# Patient Record
Sex: Female | Born: 1980
Health system: Southern US, Community
[De-identification: ages and names within clinical notes are randomized; demographics above are authoritative.]

## PROBLEM LIST (undated history)

## (undated) ENCOUNTER — Ambulatory Visit (HOSPITAL_COMMUNITY): Discharge status: Absent without leave | Payer: BLUE CROSS/BLUE SHIELD

## (undated) ENCOUNTER — Emergency Department (HOSPITAL_BASED_OUTPATIENT_CLINIC_OR_DEPARTMENT_OTHER): Admission: EM | Source: Ambulatory Visit

## (undated) DIAGNOSIS — F419 Anxiety disorder, unspecified: Secondary | ICD-10-CM

## (undated) DIAGNOSIS — I10 Essential (primary) hypertension: Secondary | ICD-10-CM

## (undated) DIAGNOSIS — K219 Gastro-esophageal reflux disease without esophagitis: Secondary | ICD-10-CM

## (undated) DIAGNOSIS — G43909 Migraine, unspecified, not intractable, without status migrainosus: Secondary | ICD-10-CM

## (undated) HISTORY — PX: TUBAL LIGATION: SHX77

## (undated) HISTORY — DX: Essential (primary) hypertension: I10

## (undated) HISTORY — DX: Gastro-esophageal reflux disease without esophagitis: K21.9

## (undated) HISTORY — PX: CHOLECYSTECTOMY: SHX55

## (undated) HISTORY — DX: Migraine, unspecified, not intractable, without status migrainosus: G43.909

---

## 2014-06-27 ENCOUNTER — Encounter (HOSPITAL_COMMUNITY): Payer: Self-pay | Admitting: Emergency Medicine

## 2014-06-27 ENCOUNTER — Emergency Department (HOSPITAL_COMMUNITY)
Admission: EM | Admit: 2014-06-27 | Discharge: 2014-06-28 | Disposition: A | Discharge status: Home / Self Care | Payer: Medicaid Other | Attending: Emergency Medicine | Admitting: Emergency Medicine

## 2014-06-27 DIAGNOSIS — R51 Headache: Secondary | ICD-10-CM | POA: Diagnosis present

## 2014-06-27 DIAGNOSIS — G43909 Migraine, unspecified, not intractable, without status migrainosus: Secondary | ICD-10-CM

## 2014-06-27 MED ORDER — METOCLOPRAMIDE HCL 5 MG/ML IJ SOLN
10.0000 mg | Freq: Once | INTRAMUSCULAR | Status: AC
  Administered 2014-06-27: 10 mg via INTRAVENOUS
  Filled 2014-06-27: qty 2

## 2014-06-27 MED ORDER — KETOROLAC TROMETHAMINE 30 MG/ML IJ SOLN
30.0000 mg | Freq: Once | INTRAMUSCULAR | Status: AC
  Administered 2014-06-27: 30 mg via INTRAVENOUS
  Filled 2014-06-27: qty 1

## 2014-06-27 MED ORDER — DIPHENHYDRAMINE HCL 50 MG/ML IJ SOLN
25.0000 mg | Freq: Once | INTRAMUSCULAR | Status: AC
  Administered 2014-06-27: 25 mg via INTRAVENOUS
  Filled 2014-06-27: qty 1

## 2014-06-27 MED ORDER — SODIUM CHLORIDE 0.9 % IV BOLUS (SEPSIS)
1000.0000 mL | Freq: Once | INTRAVENOUS | Status: AC
Start: 2014-06-27 — End: 2014-06-27
  Administered 2014-06-27: 1000 mL via INTRAVENOUS

## 2014-06-27 NOTE — ED Notes (Addendum)
Pt c/o HA onset 0500, woke her from sleep. Denies n/v/d. Pt also c/o upper chest pain with breathing x 4-5 hours. Last took imitrex 1600

## 2014-06-27 NOTE — ED Provider Notes (Signed)
CSN: [id number]     Arrival date & time 06/27/14  2104 History   First MD Initiated Contact with Patient 06/27/14 2158     Chief Complaint  Patient presents with  . Headache     (Consider location/radiation/quality/duration/timing/severity/associated sxs/prior Treatment) HPI Comments: Patient is a 34 year old female with a past medical history of chronic migraines who presents with a headache since 5:00am. Patient reports a gradual onset and progressive worsening of the headache. The pain is sharp, constant and is located in generalized head without radiation. Patient has tried OTC medication for symptoms without relief. No alleviating/aggravating factors. Patient reports associated nausea and photophobia. Patient denies fever, vomiting, diarrhea, numbness/tingling, weakness, visual changes, congestion, chest pain, SOB, abdominal pain.      History reviewed. No pertinent past medical history. Past Surgical History  Procedure Laterality Date  . Cholecystectomy    . Tubal ligation     No family history on file. History  Substance Use Topics  . Smoking status: Never Smoker   . Smokeless tobacco: Not on file  . Alcohol Use: No   OB History    No data available     Review of Systems  Constitutional: Negative for fever, chills and fatigue.  HENT: Negative for trouble swallowing.   Eyes: Negative for visual disturbance.  Respiratory: Negative for shortness of breath.   Cardiovascular: Negative for chest pain and palpitations.  Gastrointestinal: Negative for nausea, vomiting, abdominal pain and diarrhea.  Genitourinary: Negative for dysuria and difficulty urinating.  Musculoskeletal: Negative for arthralgias and neck pain.  Skin: Negative for color change.  Neurological: Positive for headaches. Negative for dizziness and weakness.  Psychiatric/Behavioral: Negative for dysphoric mood.      Allergies  Review of patient's allergies indicates no known allergies.  Home  Medications   Prior to Admission medications   Medication Sig Start Date End Date Taking? Authorizing Provider  ibuprofen (ADVIL,MOTRIN) 200 MG tablet Take 400 mg by mouth every 8 (eight) hours as needed.   Yes Historical Provider, MD  SUMAtriptan (IMITREX) 100 MG tablet Take 1 tablet by mouth 2 (two) times daily as needed for migraine.  04/01/14  Yes Historical Provider, MD   LMP 06/24/2014 Physical Exam  Constitutional: She is oriented to person, place, and time. She appears well-developed and well-nourished. No distress.  HENT:  Head: Normocephalic and atraumatic.  Eyes: Conjunctivae and EOM are normal.  Neck: Normal range of motion.  Cardiovascular: Normal rate and regular rhythm.  Exam reveals no gallop and no friction rub.   No murmur heard. Pulmonary/Chest: Effort normal and breath sounds normal. She has no wheezes. She has no rales. She exhibits no tenderness.  Abdominal: Soft. She exhibits no distension. There is no tenderness. There is no rebound.  Musculoskeletal: Normal range of motion.  Neurological: She is alert and oriented to person, place, and time. No cranial nerve deficit. Coordination normal.  Speech is goal-oriented. Moves limbs without ataxia.   Skin: Skin is warm and dry.  Psychiatric: She has a normal mood and affect. Her behavior is normal.  Nursing note and vitals reviewed.   ED Course  Procedures (including critical care time) Labs Review Labs Reviewed - No data to display  Imaging Review No results found.   EKG Interpretation None      MDM   Final diagnoses:  Migraine without status migrainosus, not intractable, unspecified migraine type    10:14 PM Patient will have migraine cocktail. No neuro deficits. Vitals stable and patient afebrile.  12:22 AM Patient feeling better and will be discharged.     Emilia BeckKaitlyn Zakee Deerman, PA-C 06/28/14 0022  Benjiman CoreNathan Pickering, MD 06/28/14 1450

## 2015-03-22 ENCOUNTER — Encounter (HOSPITAL_COMMUNITY): Payer: Self-pay | Admitting: Emergency Medicine

## 2015-03-22 ENCOUNTER — Emergency Department (HOSPITAL_COMMUNITY)
Admission: EM | Admit: 2015-03-22 | Discharge: 2015-03-22 | Discharge status: Against Medical Advice | Payer: Medicaid Other | Attending: Emergency Medicine | Admitting: Emergency Medicine

## 2015-03-22 DIAGNOSIS — G43909 Migraine, unspecified, not intractable, without status migrainosus: Secondary | ICD-10-CM | POA: Insufficient documentation

## 2015-03-22 NOTE — ED Notes (Signed)
Family member states headache starting yesterday, patient states it usually comes from her sinuses (tender to maxillary and frontal sinus palpation). Headache at front of head radiating down into right ear and lower jaw. Vomited once this morning and still feels nauseous, eyes are very sensitive to light, pt took 500 mg tylenol at 13:00 this afternoon. Denies fever, cough

## 2015-06-07 ENCOUNTER — Emergency Department (HOSPITAL_COMMUNITY)
Admission: EM | Admit: 2015-06-07 | Discharge: 2015-06-07 | Disposition: A | Discharge status: Home / Self Care | Payer: Medicaid Other | Attending: Emergency Medicine | Admitting: Emergency Medicine

## 2015-06-07 ENCOUNTER — Encounter (HOSPITAL_COMMUNITY): Payer: Self-pay | Admitting: Emergency Medicine

## 2015-06-07 DIAGNOSIS — N12 Tubulo-interstitial nephritis, not specified as acute or chronic: Secondary | ICD-10-CM | POA: Insufficient documentation

## 2015-06-07 DIAGNOSIS — Z9851 Tubal ligation status: Secondary | ICD-10-CM | POA: Insufficient documentation

## 2015-06-07 DIAGNOSIS — Z9049 Acquired absence of other specified parts of digestive tract: Secondary | ICD-10-CM | POA: Insufficient documentation

## 2015-06-07 DIAGNOSIS — Z792 Long term (current) use of antibiotics: Secondary | ICD-10-CM | POA: Insufficient documentation

## 2015-06-07 DIAGNOSIS — Z79899 Other long term (current) drug therapy: Secondary | ICD-10-CM | POA: Insufficient documentation

## 2015-06-07 DIAGNOSIS — Z791 Long term (current) use of non-steroidal anti-inflammatories (NSAID): Secondary | ICD-10-CM | POA: Insufficient documentation

## 2015-06-07 LAB — COMPREHENSIVE METABOLIC PANEL
ALT: 19 U/L (ref 14–54)
AST: 19 U/L (ref 15–41)
Albumin: 3.9 g/dL (ref 3.5–5.0)
Alkaline Phosphatase: 67 U/L (ref 38–126)
Anion gap: 9 (ref 5–15)
BUN: 11 mg/dL (ref 6–20)
CO2: 23 mmol/L (ref 22–32)
Calcium: 8.7 mg/dL — ABNORMAL LOW (ref 8.9–10.3)
Chloride: 108 mmol/L (ref 101–111)
Creatinine, Ser: 0.56 mg/dL (ref 0.44–1.00)
GFR calc Af Amer: >60 mL/min (ref >60)
GFR calc non Af Amer: >60 mL/min (ref >60)
Glucose, Bld: 108 mg/dL — ABNORMAL HIGH (ref 65–99)
Potassium: 3.5 mmol/L (ref 3.5–5.1)
Sodium: 140 mmol/L (ref 135–145)
Total Bilirubin: 0.5 mg/dL (ref 0.3–1.2)
Total Protein: 7.1 g/dL (ref 6.5–8.1)

## 2015-06-07 LAB — CBC
HCT: 34.5 % — ABNORMAL LOW (ref 36.0–46.0)
Hemoglobin: 11.6 g/dL — ABNORMAL LOW (ref 12.0–15.0)
MCH: 29.7 pg (ref 26.0–34.0)
MCHC: 33.6 g/dL (ref 30.0–36.0)
MCV: 88.2 fL (ref 78.0–100.0)
Platelets: 261 10*3/uL (ref 150–400)
RBC: 3.91 MIL/uL (ref 3.87–5.11)
RDW: 13.3 % (ref 11.5–15.5)
WBC: 15.5 10*3/uL — ABNORMAL HIGH (ref 4.0–10.5)

## 2015-06-07 LAB — URINE MICROSCOPIC-ADD ON: Bacteria, UA: NONE SEEN

## 2015-06-07 LAB — URINALYSIS, ROUTINE W REFLEX MICROSCOPIC
Bilirubin Urine: NEGATIVE
Glucose, UA: NEGATIVE mg/dL
Ketones, ur: NEGATIVE mg/dL
Nitrite: NEGATIVE
Protein, ur: NEGATIVE mg/dL
Specific Gravity, Urine: 1.01 (ref 1.005–1.030)
pH: 6 (ref 5.0–8.0)

## 2015-06-07 LAB — LIPASE, BLOOD: Lipase: 24 U/L (ref 11–51)

## 2015-06-07 LAB — POC URINE PREG, ED: Preg Test, Ur: NEGATIVE

## 2015-06-07 MED ORDER — ONDANSETRON HCL 4 MG/2ML IJ SOLN
4.0000 mg | Freq: Once | INTRAMUSCULAR | Status: AC
  Administered 2015-06-07: 4 mg via INTRAVENOUS
  Filled 2015-06-07: qty 2

## 2015-06-07 MED ORDER — GI COCKTAIL ~~LOC~~
30.0000 mL | Freq: Once | ORAL | Status: AC
  Administered 2015-06-07: 30 mL via ORAL
  Filled 2015-06-07: qty 30

## 2015-06-07 MED ORDER — SODIUM CHLORIDE 0.9 % IV BOLUS (SEPSIS)
1000.0000 mL | Freq: Once | INTRAVENOUS | Status: AC
  Administered 2015-06-07: 1000 mL via INTRAVENOUS

## 2015-06-07 MED ORDER — KETOROLAC TROMETHAMINE 30 MG/ML IJ SOLN: 30.0000 mg | Freq: Once | INTRAMUSCULAR | Status: DC

## 2015-06-07 MED ORDER — ONDANSETRON 4 MG PO TBDP: 4.0000 mg | ORAL_TABLET | Freq: Three times a day (TID) | ORAL | Status: DC | PRN

## 2015-06-07 MED ORDER — KETOROLAC TROMETHAMINE 60 MG/2ML IM SOLN
60.0000 mg | Freq: Once | INTRAMUSCULAR | Status: AC
  Administered 2015-06-07: 60 mg via INTRAMUSCULAR
  Filled 2015-06-07: qty 2

## 2015-06-07 MED ORDER — CEPHALEXIN 500 MG PO CAPS: 500.0000 mg | ORAL_CAPSULE | Freq: Three times a day (TID) | ORAL | Status: AC

## 2015-06-07 MED ORDER — NAPROXEN 500 MG PO TABS: 500.0000 mg | ORAL_TABLET | Freq: Two times a day (BID) | ORAL | Status: DC

## 2015-06-07 NOTE — Discharge Instructions (Signed)
Pielonefritis en los adultos °(Pyelonephritis, Adult) °La pielonefritis es una infección del riñón. Los riñones son los órganos que filtran la sangre y eliminan los residuos del torrente sanguíneo a través de la orina. La orina pasa desde los riñones, a través de los uréteres, hacia la vejiga. Hay dos tipos principales de pielonefritis: °· Infecciones que se inician rápidamente sin síntomas previos (pielonefritis aguda). °· Infecciones que persisten durante un período prolongado (pielonefritis crónica). °En la mayoría de los casos, la infección desaparece con el tratamiento y no causa otros problemas. Las infecciones más graves o crónicas a veces pueden propagarse al torrente sanguíneo u ocasionar otros problemas en los riñones. °CAUSAS °Por lo general, entre las causas de esta afección, se incluyen las siguientes: °· Bacterias que pasan desde la vejiga al riñón a través de la orina infectada. La orina de la vejiga puede infectarse por bacterias relacionadas con estas causas: °¨ Infección en la vejiga (cistitis). °¨ Inflamación de la próstata (prostatitis). °¨ Relaciones sexuales en las mujeres. °· Bacterias que pasan del torrente sanguíneo al riñón. °FACTORES DE RIESGO °Es más probable que esta afección se manifieste en: °· Las embarazadas. °· Las personas de edad avanzada. °· Los diabéticos. °· Las personas que tienen cálculos en los riñones o la vejiga. °· Las personas que tienen otras anomalías en el riñón o los uréteres. °· Las personas que tienen una sonda vesical. °· Las personas con cáncer. °· Las personas que son sexualmente activas. °· Las mujeres que usan espermicidas. °· Las personas que han tenido una infección previa en las vías urinarias. °SÍNTOMAS °Los síntomas de esta afección incluyen lo siguiente: °· Ganas frecuentes de orinar. °· Necesidad intensa o persistente de orinar. °· Sensación de ardor o escozor al orinar. °· Dolor abdominal. °· Dolor de espalda. °· Dolor al costado del cuerpo o en la  fosa lumbar. °· Fiebre. °· Escalofríos. °· Sangre en la orina u orina oscura. °· Náuseas. °· Vómitos. °DIAGNÓSTICO °Esta afección se puede diagnosticar en función de lo siguiente: °· Examen físico e historia clínica. °· Análisis de orina. °· Análisis de sangre. °También pueden hacerle estudios de diagnóstico por imágenes de los riñones, por ejemplo, una ecografía o una tomografía computarizada. °TRATAMIENTO °El tratamiento de esta afección puede depender de la gravedad de la infección. °· Si la infección es leve y se detecta rápidamente, pueden administrarle antibióticos por vía oral. Deberá tomar líquido para permanecer hidratado. °· Si la infección es más grave, es posible que deban hospitalizarlo para administrarle antibióticos directamente en una vena a través de una vía intravenosa (IV). Quizás también deban administrarle líquidos a través de una vía intravenosa si no se encuentra bien hidratado. Después de la hospitalización, es posible que deba tomar antibióticos durante un tiempo. °Podrán prescribirle otros tratamientos según la causa de la infección. °INSTRUCCIONES PARA EL CUIDADO EN EL HOGAR °Medicamentos °· Tome los medicamentos de venta libre y los recetados solamente como se lo haya indicado el médico. °· Si le recetaron un antibiótico, tómelo como se lo haya indicado el médico. No deje de tomar los antibióticos aunque comience a sentirse mejor. °Instrucciones generales °· Beba suficiente líquido para mantener la orina clara o de color amarillo pálido. °· Evite la cafeína, el té y las bebidas gaseosas. Estas sustancias irritan la vejiga. °· Orine con frecuencia. Evite retener la orina durante largos períodos. °· Orine antes y después de las relaciones sexuales. °· Después de defecar, las mujeres deben higienizarse la región perineal desde adelante hacia atrás. Use cada trozo de   papel higiénico solo una vez. °· Concurra a todas las visitas de control como se lo haya indicado el médico. Esto es  importante. °SOLICITE ATENCIÓN MÉDICA SI: °· Los síntomas no mejoran después de 2 días de tratamiento. °· Los síntomas empeoran. °· Tiene fiebre. °SOLICITE ATENCIÓN MÉDICA DE INMEDIATO SI: °· No puede tomar los antibióticos ni ingerir líquidos. °· Comienza a sentir escalofríos. °· Vomita. °· Siente un dolor intenso en la espalda o en la fosa lumbar. °· Se desmaya o siente una debilidad extrema. °  °Esta información no tiene como fin reemplazar el consejo del médico. Asegúrese de hacerle al médico cualquier pregunta que tenga. °  °Document Released: 11/09/2004 Document Revised: 10/21/2014 °Elsevier Interactive Patient Education ©2016 [employer] Inc[employer] ° °

## 2015-06-07 NOTE — ED Notes (Signed)
Right lower quadrant abdominal pain radiating into back. Reported generalized body aches with fever. Denies nausea and vomiting. Last BM Friday, was constipated then. Feels warm to touch, took ibuprofen at 0500 this a.m.

## 2015-06-07 NOTE — ED Provider Notes (Signed)
CSN: [id number]     Arrival date & time 06/07/15  [id number] History   First MD Initiated Contact with Patient 06/07/15 [phone]     Chief Complaint  Patient presents with  . Abdominal Pain     (Consider location/radiation/quality/duration/timing/severity/associated sxs/prior Treatment) HPI Comments: Right sided abdominal pain, began Friday Dizziness and heart burn, after on Sunday  Took ibuprofen, ice and that didn't help 1 yr ago, similar pain, thought it was muscle sprain Nausea, 1 episode of emesis Sunday decreased appetite No diarrhea +Constipation Body aching, burning inside, hot/cold Constant pain  Patient is a 35 y.o. female presenting with abdominal pain. The history is provided by the spouse and the patient. The history is limited by a language barrier. No language interpreter was used (husband interpreting).  Abdominal Pain Associated symptoms: constipation, fever (hot and cold), nausea and vomiting   Associated symptoms: no chest pain, no cough, no diarrhea, no dysuria, no shortness of breath, no sore throat, no vaginal bleeding and no vaginal discharge     History reviewed. No pertinent past medical history. Past Surgical History  Procedure Laterality Date  . Cholecystectomy    . Tubal ligation     History reviewed. No pertinent family history. Social History  Substance Use Topics  . Smoking status: Never Smoker   . Smokeless tobacco: None  . Alcohol Use: No   OB History    No data available     Review of Systems  Constitutional: Positive for fever (hot and cold).  HENT: Negative for sore throat.   Eyes: Negative for visual disturbance.  Respiratory: Negative for cough and shortness of breath.   Cardiovascular: Negative for chest pain.  Gastrointestinal: Positive for nausea, vomiting, abdominal pain and constipation. Negative for diarrhea.  Genitourinary: Negative for dysuria, vaginal bleeding, vaginal discharge and difficulty urinating.  Musculoskeletal:  Negative for back pain and neck pain.  Skin: Negative for rash.  Neurological: Negative for syncope and headaches.      Allergies  Review of patient's allergies indicates no known allergies.  Home Medications   Prior to Admission medications   Medication Sig Start Date End Date Taking? Authorizing Provider  ibuprofen (ADVIL,MOTRIN) 200 MG tablet Take 800 mg by mouth every 4 (four) hours as needed for fever, headache, mild pain, moderate pain or cramping.    Yes Historical Provider, MD  methocarbamol (ROBAXIN) 500 MG tablet Take 500 mg by mouth every 6 (six) hours as needed for muscle spasms.   Yes Historical Provider, MD  cephALEXin (KEFLEX) 500 MG capsule Take 1 capsule (500 mg total) by mouth 3 (three) times daily. 06/07/15 06/21/15  Alvira MondayErin Venus Gilles, MD  naproxen (NAPROSYN) 500 MG tablet Take 1 tablet (500 mg total) by mouth 2 (two) times daily with a meal. 06/07/15   Alvira MondayErin Dallas Torok, MD  ondansetron (ZOFRAN ODT) 4 MG disintegrating tablet Take 1 tablet (4 mg total) by mouth every 8 (eight) hours as needed for nausea or vomiting. 06/07/15   Alvira MondayErin Aadil Sur, MD   BP 112/66 mmHg  Pulse 107  Temp(Src) 98.5 F (36.9 C) (Oral)  Resp 18  SpO2 97% Physical Exam  Constitutional: She is oriented to person, place, and time. She appears well-developed and well-nourished. No distress.  HENT:  Head: Normocephalic and atraumatic.  Eyes: Conjunctivae and EOM are normal.  Neck: Normal range of motion.  Cardiovascular: Normal rate, regular rhythm, normal heart sounds and intact distal pulses.  Exam reveals no gallop and no friction rub.   No murmur heard. Pulmonary/Chest: Effort  normal and breath sounds normal. No respiratory distress. She has no wheezes. She has no rales.  Abdominal: Soft. She exhibits no distension. There is tenderness in the right upper quadrant. There is CVA tenderness (right) and positive Murphy's sign. There is no rigidity, no guarding and no tenderness at McBurney's point.   Musculoskeletal: She exhibits no edema or tenderness.  Neurological: She is alert and oriented to person, place, and time.  Skin: Skin is warm and dry. No rash noted. She is not diaphoretic. No erythema.  Nursing note and vitals reviewed.   ED Course  Procedures (including critical care time) Labs Review Labs Reviewed  COMPREHENSIVE METABOLIC PANEL - Abnormal; Notable for the following:    Glucose, Bld 108 (*)    Calcium 8.7 (*)    All other components within normal limits  CBC - Abnormal; Notable for the following:    WBC 15.5 (*)    Hemoglobin 11.6 (*)    HCT 34.5 (*)    All other components within normal limits  URINALYSIS, ROUTINE W REFLEX MICROSCOPIC (NOT AT [facility]) - Abnormal; Notable for the following:    APPearance CLOUDY (*)    Hgb urine dipstick SMALL (*)    Leukocytes, UA LARGE (*)    All other components within normal limits  URINE MICROSCOPIC-ADD ON - Abnormal; Notable for the following:    Squamous Epithelial / LPF 6-30 (*)    All other components within normal limits  URINE CULTURE  LIPASE, BLOOD  POC URINE PREG, ED    Imaging Review No results found. I have personally reviewed and evaluated these images and lab results as part of my medical decision-making.   EKG Interpretation None      MDM   Final diagnoses:  Pyelonephritis   35 year old female with a history of cholecystectomy presents with concern for right-sided abdominal pain. Patient does not have lower abdominal tenderness, and have low suspicion for pelvic etiology of pain including low suspicion for tubo-ovarian abscess, torsion, PID. She does not have right lower quadrant tenderness on exam, have low suspicion for appendicitis by physical exam. Patient does have right upper quadrant tenderness on exam, however is postcholecystectomy.  Labs show leukocytosis, normal lipase and transaminases. Pregnancy test negative.  Reevalauted patient in the ED with continued benign exam, specifically no RLQ  tenderness and doubt appendicitis.  Urinalysis concerning for UTI. Given urinalysis and flank pain, feel patient has pyelonephritis. Given rx for keflex to take for 2 weeks and zofran. Patient discharged in stable condition with understanding of reasons to return.    Alvira Monday, MD 06/07/15 2149

## 2015-06-10 LAB — URINE CULTURE: Culture: 30000 — AB

## 2015-06-11 ENCOUNTER — Telehealth: Payer: Self-pay | Admitting: *Deleted

## 2015-06-11 NOTE — Progress Notes (Signed)
ED Antimicrobial Stewardship Positive Culture Follow Up   [name] is an 35 y.o. female who [facility] with a chief complaint of  Chief Complaint  Patient presents with  . Abdominal Pain    Recent Results (from the past 720 hour(s))  Urine culture     Status: Abnormal   Collection Time: 06/07/15 10:57 AM  Result Value Ref Range Status   Specimen Description URINE, RANDOM  Final   Special Requests NONE  Final   Culture 30,000 COLONIES/mL ESCHERICHIA COLI (A)  Final   Report Status 06/10/2015 FINAL  Final   Organism ID, Bacteria ESCHERICHIA COLI (A)  Final      Susceptibility   Escherichia coli - MIC*    AMPICILLIN >=32 RESISTANT Resistant     CEFAZOLIN >=64 RESISTANT Resistant     CEFTRIAXONE <=1 SENSITIVE Sensitive     CIPROFLOXACIN <=0.25 SENSITIVE Sensitive     GENTAMICIN <=1 SENSITIVE Sensitive     IMIPENEM <=0.25 SENSITIVE Sensitive     NITROFURANTOIN 32 SENSITIVE Sensitive     TRIMETH/SULFA >=320 RESISTANT Resistant     AMPICILLIN/SULBACTAM >=32 RESISTANT Resistant     PIP/TAZO >=128 RESISTANT Resistant     * 30,000 COLONIES/mL ESCHERICHIA COLI   Only 30 k ecoli, but diagnosed with pyelo   D/C Keflex add levofloxacin 250 mg qd x 1 week  ED Provider: Gennette PacNicole Pisciatta, PA-C  Paige Bruce 06/11/2015, 8:17 AM Infectious Diseases Pharmacist Phone# [phone]

## 2015-06-11 NOTE — ED Notes (Signed)
Post ED Visit - Positive Culture Follow-up: Successful Patient Follow-Up  Culture assessed and recommendations reviewed by: []  Enzo BiNathan Batchelder, Pharm.D. []  Celedonio MiyamotoJeremy Frens, Pharm.D., BCPS []  Garvin FilaMike Maccia, Pharm.D. []  Georgina PillionElizabeth Martin, Pharm.D., BCPS []  IowaMinh Pham, [address]Pharm[address]D., BCPS, AAHIVP []  Estella HuskMichelle Turner, Pharm.D., BCPS, AAHIVP []  Tennis Mustassie Stewart, Pharm.D. []  Sherle Poeob Vincent, [address]  Positive urine culture  []  Patient discharged without antimicrobial prescription and treatment is now indicated [x]  Organism is resistant to prescribed ED discharge antimicrobial []  Patient with positive blood cultures  Changes discussed with ED provider Wynetta EmeryNicole Pisciotta, PA-C New antibiotic prescription Levofloxacin 250mg  PO BID X 1 week Called to [facility], [address], [place], [phone]  Contacted patient, date 06/11/2015, time 1134   Lysle PearlRobertson, Corley Kohls [place]ley 06/11/2015, 11:30 AM

## 2015-11-29 ENCOUNTER — Emergency Department (HOSPITAL_BASED_OUTPATIENT_CLINIC_OR_DEPARTMENT_OTHER): Payer: BLUE CROSS/BLUE SHIELD

## 2015-11-29 ENCOUNTER — Emergency Department (HOSPITAL_BASED_OUTPATIENT_CLINIC_OR_DEPARTMENT_OTHER)
Admission: EM | Admit: 2015-11-29 | Discharge: 2015-11-29 | Disposition: A | Discharge status: Home / Self Care | Payer: BLUE CROSS/BLUE SHIELD | Attending: Emergency Medicine | Admitting: Emergency Medicine

## 2015-11-29 ENCOUNTER — Encounter (HOSPITAL_BASED_OUTPATIENT_CLINIC_OR_DEPARTMENT_OTHER): Payer: Self-pay

## 2015-11-29 DIAGNOSIS — R102 Pelvic and perineal pain: Secondary | ICD-10-CM | POA: Insufficient documentation

## 2015-11-29 DIAGNOSIS — R300 Dysuria: Secondary | ICD-10-CM | POA: Diagnosis present

## 2015-11-29 DIAGNOSIS — R1030 Lower abdominal pain, unspecified: Secondary | ICD-10-CM | POA: Diagnosis not present

## 2015-11-29 LAB — WET PREP, GENITAL
Clue Cells Wet Prep HPF POC: NONE SEEN
Sperm: NONE SEEN
Trich, Wet Prep: NONE SEEN
Yeast Wet Prep HPF POC: NONE SEEN

## 2015-11-29 LAB — URINALYSIS, ROUTINE W REFLEX MICROSCOPIC
Bilirubin Urine: NEGATIVE
Glucose, UA: NEGATIVE mg/dL
Hgb urine dipstick: NEGATIVE
Ketones, ur: NEGATIVE mg/dL
Nitrite: NEGATIVE
Protein, ur: NEGATIVE mg/dL
Specific Gravity, Urine: 1.003 — ABNORMAL LOW (ref 1.005–1.030)
pH: 6.5 (ref 5.0–8.0)

## 2015-11-29 LAB — URINE MICROSCOPIC-ADD ON

## 2015-11-29 LAB — PREGNANCY, URINE: Preg Test, Ur: NEGATIVE

## 2015-11-29 MED ORDER — CEPHALEXIN 500 MG PO CAPS: 500.0000 mg | ORAL_CAPSULE | Freq: Three times a day (TID) | ORAL | 0 refills | Status: DC

## 2015-11-29 MED ORDER — IBUPROFEN 400 MG PO TABS
600.0000 mg | ORAL_TABLET | Freq: Once | ORAL | Status: AC
  Administered 2015-11-29: 600 mg via ORAL
  Filled 2015-11-29: qty 1

## 2015-11-29 MED ORDER — ONDANSETRON HCL 4 MG PO TABS: 4.0000 mg | ORAL_TABLET | Freq: Four times a day (QID) | ORAL | 0 refills | Status: DC

## 2015-11-29 NOTE — ED Triage Notes (Addendum)
Pt c/o urinary frequency and pain as well as RLQ pain and lower back pain since yesterday.  Pt needs Spanish interpreter

## 2015-11-29 NOTE — Discharge Instructions (Signed)
Continue to take ibuprofen and tylenol for pain.   You are given antibiotics for potential UTI.  Follow-up with your primary care doctor.  Return for worsening symptoms, including fever, worsening pain, intractable vomiting or any other symptoms concerning to you.

## 2015-11-29 NOTE — ED Provider Notes (Signed)
[facility] Provider Note   CSN: [id number] Arrival date & time: 11/29/15  2036  By signing my name below, I, Soijett Blue, attest that this documentation has been prepared under the direction and in the presence of Lavera Guiseana Duo Tarrin Lebow, MD. Electronically Signed: Soijett Blue, ED Scribe. 11/29/15. 10:00 PM.   History   Chief Complaint Chief Complaint  Patient presents with  . Dysuria    HPI Paige Bruce is a 35 y.o. female who presents to the Emergency Department complaining of dysuria onset this morning. She states that she is having associated symptoms of suprapubic abdominal pain x this morning, nausea, vomiting, vaginal discharge, right lower back pain, HA, and urinary frequency with mild dysuria. Pt notes that she has had abdominal pain in the past due to infection to her ovary. She states that she has not tried any medications for the relief of her symptoms. She denies fever, vaginal bleeding, diarrhea, and any other symptoms.   The history is provided by the patient. A language interpreter was used (spanish).    History reviewed. No pertinent past medical history.  There are no active problems to display for this patient.   Past Surgical History:  Procedure Laterality Date  . CHOLECYSTECTOMY    . TUBAL LIGATION      OB History    No data available       Home Medications    Prior to Admission medications   Medication Sig Start Date End Date Taking? Authorizing Provider  cephALEXin (KEFLEX) 500 MG capsule Take 1 capsule (500 mg total) by mouth 3 (three) times daily. 11/29/15   Lavera Guiseana Duo Enza Shone, MD  ibuprofen (ADVIL,MOTRIN) 200 MG tablet Take 800 mg by mouth every 4 (four) hours as needed for fever, headache, mild pain, moderate pain or cramping.     Historical Provider, MD  methocarbamol (ROBAXIN) 500 MG tablet Take 500 mg by mouth every 6 (six) hours as needed for muscle spasms.    Historical Provider, MD  naproxen (NAPROSYN) 500 MG tablet Take 1 tablet (500  mg total) by mouth 2 (two) times daily with a meal. 06/07/15   Alvira MondayErin Schlossman, MD  ondansetron (ZOFRAN ODT) 4 MG disintegrating tablet Take 1 tablet (4 mg total) by mouth every 8 (eight) hours as needed for nausea or vomiting. 06/07/15   Alvira MondayErin Schlossman, MD  ondansetron (ZOFRAN) 4 MG tablet Take 1 tablet (4 mg total) by mouth every 6 (six) hours. 11/29/15   Lavera Guiseana Duo Telena Peyser, MD    Family History No family history on file.  Social History Social History  Substance Use Topics  . Smoking status: Never Smoker  . Smokeless tobacco: Not on file  . Alcohol use No     Allergies   Review of patient's allergies indicates no known allergies.   Review of Systems Review of Systems 10/14 systems reviewed and are negative other than those stated in the HPI  Physical Exam Updated Vital Signs BP 112/74 (BP Location: Right Arm)   Pulse 77   Temp 98.4 F (36.9 C) (Oral)   Resp 16   Ht 4\' 11"  (1.499 m)   Wt 139 lb (63 kg)   LMP 11/10/2015   SpO2 100%   BMI 28.07 kg/m   Physical Exam Physical Exam  Nursing note and vitals reviewed. Constitutional: Well developed, well nourished, non-toxic, and in no acute distress Head: Normocephalic and atraumatic.  Mouth/Throat: Oropharynx is clear and moist.  Neck: Normal range of motion. Neck supple.  Cardiovascular: Normal rate  and regular rhythm.   Pulmonary/Chest: Effort normal and breath sounds normal.  Abdominal: Soft. There is suprapubic tenderness. No tenderness at McBurney's point. Negative Murphy sign. There is no rebound and no guarding. No CVA tenderness GU: Chaperone present for exam. Copious vaginal discharge. No vaginal bleeding. No CMT. No adnexal tenderness or masses. Musculoskeletal: Normal range of motion.  Neurological: Alert, no facial droop, fluent speech, moves all extremities symmetrically Skin: Skin is warm and dry.  Psychiatric: Cooperative   ED Treatments / Results  DIAGNOSTIC STUDIES: Oxygen Saturation is 100% on RA, nl  by my interpretation.    COORDINATION OF CARE: 9:58 PM Discussed treatment plan with pt at bedside which includes UA, pelvic exam, wet prep, GC/Chlamydia probe, HIV, RPR, US pelvis, and pt agreed to plan.  Labs (all labs ordered are listed, but only abnormal results are displayed) Labs Reviewed  WET PREP, GENITAL - Abnormal; Notable for the following:       Result Value   WBC, Wet Prep HPF POC MANY (*)    All other components within normal limits  URINALYSIS, ROUTINE W REFLEX MICROSCOPIC (NOT AT [facility]) - Abnormal; Notable for the following:    APPearance CLOUDY (*)    Specific Gravity, Urine 1.003 (*)    Leukocytes, UA MODERATE (*)    All other components within normal limits  URINE MICROSCOPIC-ADD ON - Abnormal; Notable for the following:    Squamous Epithelial / LPF 0-5 (*)    Bacteria, UA FEW (*)    All other components within normal limits  URINE CULTURE  PREGNANCY, URINE  HIV ANTIBODY (ROUTINE TESTING)  RPR  GC/CHLAMYDIA PROBE AMP ([place]) NOT AT [facility]    Radiology US Transvaginal Non-ob  Result Date: 11/29/2015 CLINICAL DATA:  Post right lower quadrant suprapubic pain intermittently for 6 months. White heavy vaginal discharge for 2-3 days. History of tubal ligation. EXAM: TRANSABDOMINAL AND TRANSVAGINAL ULTRASOUND OF PELVIS DOPPLER ULTRASOUND OF OVARIES TECHNIQUE: Both transabdominal and transvaginal ultrasound examinations of the pelvis were performed. Transabdominal technique was performed for global imaging of the pelvis including uterus, ovaries, adnexal regions, and pelvic cul-de-sac. It was necessary to proceed with endovaginal exam following the transabdominal exam to visualize the uterus, endometrium and ovaries. Color and duplex Doppler ultrasound was utilized to evaluate blood flow to the ovaries. COMPARISON:  None. FINDINGS: Uterus Measurements: 9.5 x 5 x 6.8 cm. No fibroids or other mass visualized. Endometrium Thickness: 17 mm.  No focal abnormality visualized.  Right ovary Measurements: 3.4 x 1.4 x 2.4 cm. Normal appearance/no adnexal mass. Left ovary Measurements: 3.1 x 2.7 x 3.2 cm. 2.2 x all corpus luteal cyst suggested. Pulsed Doppler evaluation of both ovaries demonstrates normal low-resistance arterial and venous waveforms. Other findings No abnormal free fluid. IMPRESSION: Probable 2.2 x 1.9 x 2 cm corpus luteal cyst of the left ovary. No sonographic findings for the patient's right-sided nor suprapubic pain. Electronically Signed   By: Tollie Eth M.D.   On: 11/29/2015 23:20   US Pelvis Complete  Result Date: 11/29/2015 CLINICAL DATA:  Post right lower quadrant suprapubic pain intermittently for 6 months. White heavy vaginal discharge for 2-3 days. History of tubal ligation. EXAM: TRANSABDOMINAL AND TRANSVAGINAL ULTRASOUND OF PELVIS DOPPLER ULTRASOUND OF OVARIES TECHNIQUE: Both transabdominal and transvaginal ultrasound examinations of the pelvis were performed. Transabdominal technique was performed for global imaging of the pelvis including uterus, ovaries, adnexal regions, and pelvic cul-de-sac. It was necessary to proceed with endovaginal exam following the transabdominal exam to visualize the  uterus, endometrium and ovaries. Color and duplex Doppler ultrasound was utilized to evaluate blood flow to the ovaries. COMPARISON:  None. FINDINGS: Uterus Measurements: 9.5 x 5 x 6.8 cm. No fibroids or other mass visualized. Endometrium Thickness: 17 mm.  No focal abnormality visualized. Right ovary Measurements: 3.4 x 1.4 x 2.4 cm. Normal appearance/no adnexal mass. Left ovary Measurements: 3.1 x 2.7 x 3.2 cm. 2.2 x all corpus luteal cyst suggested. Pulsed Doppler evaluation of both ovaries demonstrates normal low-resistance arterial and venous waveforms. Other findings No abnormal free fluid. IMPRESSION: Probable 2.2 x 1.9 x 2 cm corpus luteal cyst of the left ovary. No sonographic findings for the patient's right-sided nor suprapubic pain. Electronically Signed    By: Tollie Eth M.D.   On: 11/29/2015 23:20   [place] Art/ven Flow Abd Pelv Doppler  Result Date: 11/29/2015 CLINICAL DATA:  Post right lower quadrant suprapubic pain intermittently for 6 months. White heavy vaginal discharge for 2-3 days. History of tubal ligation. EXAM: TRANSABDOMINAL AND TRANSVAGINAL ULTRASOUND OF PELVIS DOPPLER ULTRASOUND OF OVARIES TECHNIQUE: Both transabdominal and transvaginal ultrasound examinations of the pelvis were performed. Transabdominal technique was performed for global imaging of the pelvis including uterus, ovaries, adnexal regions, and pelvic cul-de-sac. It was necessary to proceed with endovaginal exam following the transabdominal exam to visualize the uterus, endometrium and ovaries. Color and duplex Doppler ultrasound was utilized to evaluate blood flow to the ovaries. COMPARISON:  None. FINDINGS: Uterus Measurements: 9.5 x 5 x 6.8 cm. No fibroids or other mass visualized. Endometrium Thickness: 17 mm.  No focal abnormality visualized. Right ovary Measurements: 3.4 x 1.4 x 2.4 cm. Normal appearance/no adnexal mass. Left ovary Measurements: 3.1 x 2.7 x 3.2 cm. 2.2 x all corpus luteal cyst suggested. Pulsed Doppler evaluation of both ovaries demonstrates normal low-resistance arterial and venous waveforms. Other findings No abnormal free fluid. IMPRESSION: Probable 2.2 x 1.9 x 2 cm corpus luteal cyst of the left ovary. No sonographic findings for the patient's right-sided nor suprapubic pain. Electronically Signed   By: Tollie Eth M.D.   On: 11/29/2015 23:20    Procedures Procedures (including critical care time)  Medications Ordered in ED Medications  ibuprofen (ADVIL,MOTRIN) tablet 600 mg (600 mg Oral Given 11/29/15 2334)     Initial Impression / Assessment and Plan / ED Course  I have reviewed the triage vital signs and the nursing notes.  Pertinent labs & imaging results that were available during my care of the patient were reviewed by me and considered in  my medical decision making (see chart for details).  Clinical Course   35 year old female who presents with low abdominal pain with vaginal discharge. Presentation, she is nontoxic in no acute distress with stable vital signs and is afebrile. She has a soft and benign abdomen. Her pain is primarily localized over the low midline pelvis and over the suprapubic abdomen. No CVA tenderness noted to suggest pyelonephritis. No tenderness at McBurney's point to raise concern for appendicitis. On pelvic exam overall was unremarkable, pelvic ultrasound with left corpus luteum cyst. No evidence of acute intrapelvic processes such as ovarian cyst, fibroids, or torsion. Given her urinary symptoms and UA with some leukocytes will treat for potential cystitis with course of keflex.   The patient appears reasonably screened and/or stabilized for discharge and I doubt any other medical condition or other [facility] requiring further screening, evaluation, or treatment in the ED at this time prior to discharge.  Strict return and follow-up instructions reviewed. She  expressed understanding of all discharge instructions and felt comfortable with the plan of care.     Final Clinical Impressions(s) / ED Diagnoses   Final diagnoses:  Pelvic pain  Acute pelvic pain, female  Suprapubic pain    New Prescriptions Discharge Medication List as of 11/29/2015 11:41 PM    START taking these medications   Details  cephALEXin (KEFLEX) 500 MG capsule Take 1 capsule (500 mg total) by mouth 3 (three) times daily., Starting Mon 11/29/2015, Print    ondansetron (ZOFRAN) 4 MG tablet Take 1 tablet (4 mg total) by mouth every 6 (six) hours., Starting Mon 11/29/2015, Print        I personally performed the services described in this documentation, which was scribed in my presence. The recorded information has been reviewed and is accurate.     Lavera Guise, MD 11/29/15 2352

## 2015-11-29 NOTE — ED Notes (Signed)
Patient transported to Ultrasound 

## 2015-11-29 NOTE — ED Notes (Signed)
MD at bedside discussing test results and dispo plan of care. 

## 2015-12-01 LAB — URINE CULTURE: Culture: NO GROWTH

## 2015-12-01 LAB — RPR: RPR Ser Ql: NONREACTIVE

## 2015-12-01 LAB — HIV ANTIBODY (ROUTINE TESTING W REFLEX): HIV Screen 4th Generation wRfx: NONREACTIVE

## 2015-12-01 LAB — GC/CHLAMYDIA PROBE AMP (~~LOC~~) NOT AT ARMC
Chlamydia: NEGATIVE
Neisseria Gonorrhea: NEGATIVE

## 2016-05-17 ENCOUNTER — Emergency Department (HOSPITAL_BASED_OUTPATIENT_CLINIC_OR_DEPARTMENT_OTHER)
Admission: EM | Admit: 2016-05-17 | Discharge: 2016-05-17 | Disposition: A | Discharge status: Home / Self Care | Payer: BLUE CROSS/BLUE SHIELD | Attending: Emergency Medicine | Admitting: Emergency Medicine

## 2016-05-17 ENCOUNTER — Encounter (HOSPITAL_BASED_OUTPATIENT_CLINIC_OR_DEPARTMENT_OTHER): Payer: Self-pay

## 2016-05-17 DIAGNOSIS — M79674 Pain in right toe(s): Secondary | ICD-10-CM | POA: Diagnosis present

## 2016-05-17 DIAGNOSIS — B351 Tinea unguium: Secondary | ICD-10-CM | POA: Diagnosis not present

## 2016-05-17 MED ORDER — IBUPROFEN 600 MG PO TABS: 600.0000 mg | ORAL_TABLET | Freq: Four times a day (QID) | ORAL | 0 refills | Status: DC | PRN

## 2016-05-17 MED ORDER — TERBINAFINE HCL 250 MG PO TABS: 250.0000 mg | ORAL_TABLET | Freq: Every day | ORAL | 0 refills | Status: AC

## 2016-05-17 MED FILL — IBUPROFEN 600 MG TABLET: 600 | 7 days supply | Qty: 30 | Fill #0

## 2016-05-17 MED FILL — TERBINAFINE HCL 250 MG TAB: 250 | 84 days supply | Qty: 84 | Fill #0

## 2016-05-17 NOTE — Discharge Instructions (Signed)
Take the medication as prescribed.   You can take ibuprofen as directed for pain.   Follow-up with your primary care doctor in 1 week for re-evaluation.   If you do not have a primary care doctor, you can use the list below to call one and arrange follow-up.   If you do not have a primary care doctor you see regularly, please you the list below. Please call them to arrange for follow-up.    No Primary Care Doctor Call [facility]  [phone] Other agencies that provide inexpensive medical care    [facility]  [phone]    [facility]  [phone]    [facility]  [phone]    [facility]  [phone]    [facility]  [phone]    [facility]  [phone]

## 2016-05-17 NOTE — ED Triage Notes (Signed)
c/o pain to right great toe x 2 days-denies injury-NAD-steady gait-husband interpreting

## 2016-05-17 NOTE — ED Provider Notes (Signed)
[facility]   CSN: [id number] Arrival date & time: 05/17/16  1428     History   Chief Complaint Chief Complaint  Patient presents with  . Toe Pain    HPI Paige Bruce is a 36 y.o. female who presents with 2 days of worsening right 1st toe pain. Patient states that pain is localized to her nail bed and does not radiate. She states it has worsened over the last 2 days. She has taken ibuprofen 400 mg with no improvement in pain. She denies any redness or bruising to toe.  She denies any injury to the toe or any history of gout. Patient does regularly get pedicures and states she received one a few weeks ago.   The history is provided by the patient.    History reviewed. No pertinent past medical history.  There are no active problems to display for this patient.   Past Surgical History:  Procedure Laterality Date  . CHOLECYSTECTOMY    . TUBAL LIGATION      OB History    No data available       Home Medications    Prior to Admission medications   Medication Sig Start Date End Date Taking? Authorizing Provider  ibuprofen (ADVIL,MOTRIN) 600 MG tablet Take 1 tablet (600 mg total) by mouth every 6 (six) hours as needed. 05/17/16   Maxwell Caul, PA-C  terbinafine (LAMISIL) 250 MG tablet Take 1 tablet (250 mg total) by mouth daily. 05/17/16 08/09/16  Maxwell Caul, PA-C    Family History No family history on file.  Social History Social History  Substance Use Topics  . Smoking status: Never Smoker  . Smokeless tobacco: Never Used  . Alcohol use No     Allergies   Patient has no known allergies.   Review of Systems Review of Systems  Constitutional: Negative for chills and fever.  Respiratory: Negative for shortness of breath.   Cardiovascular: Negative for chest pain.  Gastrointestinal: Negative for nausea and vomiting.  Musculoskeletal:       +Right 1st toe pain  Skin: Negative for color change.  All other systems reviewed  and are negative.    Physical Exam Updated Vital Signs BP 123/83 (BP Location: Left Arm)   Pulse 95   Temp 98.2 F (36.8 C) (Oral)   Resp 18   Ht  (1.6 m)   Wt 61.2 kg   LMP 05/02/2016   SpO2 99%   BMI 23.91 kg/m   Physical Exam  Constitutional: She is oriented to person, place, and time. She appears well-developed and well-nourished.  HENT:  Head: Normocephalic and atraumatic.  Eyes: Conjunctivae and EOM are normal. Right eye exhibits no discharge. Left eye exhibits no discharge. No scleral icterus.  Cardiovascular: Normal rate and regular rhythm.   Pulses:      Dorsalis pedis pulses are 2+ on the right side, and 2+ on the left side.  Pulmonary/Chest: Effort normal.  Musculoskeletal: She exhibits no deformity.  FROM of right 1st toe.  Neurological: She is alert and oriented to person, place, and time.  Full dorsiflexion and plantar flexion of bilateral feet intact.  Skin: Skin is warm and dry. Capillary refill takes less than 2 seconds.  Right 1st toe with thickened, yellow nail. No surrounding erythema, swelling, warmth or tenderness.   Psychiatric: She has a normal mood and affect. Her speech is normal and behavior is normal.     ED Treatments / Results  Labs (  all labs ordered are listed, but only abnormal results are displayed) Labs Reviewed - No data to display  EKG  EKG Interpretation None       Radiology No results found.  Procedures Procedures (including critical care time)  Medications Ordered in ED Medications - No data to display   Initial Impression / Assessment and Plan / ED Course  I have reviewed the triage vital signs and the nursing notes.  Pertinent labs & imaging results that were available during my care of the patient were reviewed by me and considered in my medical decision making (see chart for details).    36 yo F presents with 2 days of right first toe pain. History of pedicures. No history of gout. No concerning  infectious signs on physical exam. Physical exam consistent with onychomycosis.  History and physical suspicious for onychomycosis. Plan to treat patient with topical antifungal. Instructed patient she can take ibuprofen as needed for pain. Return precautions discussed. Provided patient with a list of clinics for follow up if needed. Patient and husband express understanding and agreement to plan.   Final Clinical Impressions(s) / ED Diagnoses   Final diagnoses:  Onychomycosis    New Prescriptions Discharge Medication List as of 05/17/2016  3:24 PM    START taking these medications   Details  ibuprofen (ADVIL,MOTRIN) 600 MG tablet Take 1 tablet (600 mg total) by mouth every 6 (six) hours as needed., Starting Wed 05/17/2016, Print    terbinafine (LAMISIL) 250 MG tablet Take 1 tablet (250 mg total) by mouth daily., Starting Wed 05/17/2016, Until Wed 08/09/2016, Print         Maxwell Caul, PA-C 05/17/16 1533    Charlynne Pander, MD 05/19/16 [phone]

## 2016-07-15 ENCOUNTER — Encounter (HOSPITAL_BASED_OUTPATIENT_CLINIC_OR_DEPARTMENT_OTHER): Payer: Self-pay | Admitting: *Deleted

## 2016-07-15 ENCOUNTER — Emergency Department (HOSPITAL_BASED_OUTPATIENT_CLINIC_OR_DEPARTMENT_OTHER)
Admission: EM | Admit: 2016-07-15 | Discharge: 2016-07-15 | Disposition: A | Discharge status: Home / Self Care | Payer: BLUE CROSS/BLUE SHIELD | Attending: Emergency Medicine | Admitting: Emergency Medicine

## 2016-07-15 DIAGNOSIS — S199XXA Unspecified injury of neck, initial encounter: Secondary | ICD-10-CM | POA: Diagnosis present

## 2016-07-15 DIAGNOSIS — M545 Low back pain: Secondary | ICD-10-CM | POA: Diagnosis not present

## 2016-07-15 DIAGNOSIS — Y92410 Unspecified street and highway as the place of occurrence of the external cause: Secondary | ICD-10-CM | POA: Diagnosis not present

## 2016-07-15 DIAGNOSIS — Y999 Unspecified external cause status: Secondary | ICD-10-CM | POA: Insufficient documentation

## 2016-07-15 DIAGNOSIS — Y939 Activity, unspecified: Secondary | ICD-10-CM | POA: Diagnosis not present

## 2016-07-15 MED ORDER — MELOXICAM 15 MG PO TABS: 15.0000 mg | ORAL_TABLET | Freq: Every day | ORAL | 0 refills | Status: DC

## 2016-07-15 MED ORDER — BACLOFEN 10 MG PO TABS: 10.0000 mg | ORAL_TABLET | Freq: Three times a day (TID) | ORAL | 0 refills | Status: DC
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
## 2016-07-15 NOTE — Discharge Instructions (Signed)
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
## 2016-10-22 ENCOUNTER — Emergency Department (HOSPITAL_BASED_OUTPATIENT_CLINIC_OR_DEPARTMENT_OTHER)
Admission: EM | Admit: 2016-10-22 | Discharge: 2016-10-22 | Disposition: A | Discharge status: Home / Self Care | Payer: BLUE CROSS/BLUE SHIELD | Attending: Emergency Medicine | Admitting: Emergency Medicine

## 2016-10-22 ENCOUNTER — Emergency Department (HOSPITAL_BASED_OUTPATIENT_CLINIC_OR_DEPARTMENT_OTHER): Payer: BLUE CROSS/BLUE SHIELD

## 2016-10-22 ENCOUNTER — Encounter (HOSPITAL_BASED_OUTPATIENT_CLINIC_OR_DEPARTMENT_OTHER): Payer: Self-pay | Admitting: Emergency Medicine

## 2016-10-22 DIAGNOSIS — M25511 Pain in right shoulder: Secondary | ICD-10-CM | POA: Diagnosis not present

## 2016-10-22 DIAGNOSIS — Y929 Unspecified place or not applicable: Secondary | ICD-10-CM | POA: Insufficient documentation

## 2016-10-22 DIAGNOSIS — Y9389 Activity, other specified: Secondary | ICD-10-CM | POA: Insufficient documentation

## 2016-10-22 DIAGNOSIS — Y999 Unspecified external cause status: Secondary | ICD-10-CM | POA: Insufficient documentation

## 2016-10-22 DIAGNOSIS — Z79899 Other long term (current) drug therapy: Secondary | ICD-10-CM | POA: Diagnosis not present

## 2016-10-22 DIAGNOSIS — X500XXA Overexertion from strenuous movement or load, initial encounter: Secondary | ICD-10-CM | POA: Diagnosis not present

## 2016-10-22 MED ORDER — METHOCARBAMOL 500 MG PO TABS: 500.0000 mg | ORAL_TABLET | Freq: Three times a day (TID) | ORAL | 0 refills | Status: DC | PRN

## 2016-10-22 MED ORDER — KETOROLAC TROMETHAMINE 30 MG/ML IJ SOLN
30.0000 mg | Freq: Once | INTRAMUSCULAR | Status: AC
  Administered 2016-10-22: 30 mg via INTRAMUSCULAR
  Filled 2016-10-22: qty 1

## 2016-10-22 NOTE — ED Triage Notes (Signed)
Patient states that she has had pain to her right shoulder x 1 week. Has been to an Urgent care, the medications that they gave her have not helped

## 2016-10-22 NOTE — ED Provider Notes (Signed)
[facility]   CSN: [id number] Arrival date & time: 10/22/16  1341     History   Chief Complaint Chief Complaint  Patient presents with  . Shoulder Pain    HPI Paige Bruce is a 36 y.o. female.  HPI 36 year old female who presents with gradually worsening right shoulder pain over the past 2 days. 3 days ago was moving a couch/sofa with her significant other. Did not have fall or trauma. Was seen at an urgent care and diagnosed with muscle skeletal pain. Started on ibuprofen. Pain is not improved with ibuprofen. Denies any numbness or weakness to the arm. No swelling or skin changes.   History reviewed. No pertinent past medical history.  There are no active problems to display for this patient.   Past Surgical History:  Procedure Laterality Date  . CHOLECYSTECTOMY    . TUBAL LIGATION      OB History    No data available       Home Medications    Prior to Admission medications   Medication Sig Start Date End Date Taking? Authorizing Provider  baclofen (LIORESAL) 10 MG tablet Take 1 tablet (10 mg total) by mouth 3 (three) times daily. 07/15/16   Arthor Captain, PA-C  ibuprofen (ADVIL,MOTRIN) 600 MG tablet Take 1 tablet (600 mg total) by mouth every 6 (six) hours as needed. 05/17/16   Maxwell Caul, PA-C  meloxicam (MOBIC) 15 MG tablet Take 1 tablet (15 mg total) by mouth daily. Take 1 daily with food. 07/15/16   Arthor Captain, PA-C  methocarbamol (ROBAXIN) 500 MG tablet Take 1 tablet (500 mg total) by mouth every 8 (eight) hours as needed for muscle spasms. 10/22/16   Lavera Guise, MD    Family History History reviewed. No pertinent family history.  Social History Social History  Substance Use Topics  . Smoking status: Never Smoker  . Smokeless tobacco: Never Used  . Alcohol use No     Allergies   Patient has no known allergies.   Review of Systems Review of Systems  Constitutional: Negative for fever.  Respiratory: Negative  for cough and shortness of breath.   Cardiovascular: Negative for chest pain.  Musculoskeletal:       Right shoulder pain   Skin: Negative for wound.  Allergic/Immunologic: Negative for immunocompromised state.  Neurological: Negative for weakness and numbness.  Hematological: Does not bruise/bleed easily.     Physical Exam Updated Vital Signs BP 116/67 (BP Location: Left Arm)   Pulse 74   Temp 98.7 F (37.1 C) (Oral)   Resp 20   Ht  (1.6 m)   Wt 63 kg (139 lb)   LMP 09/23/2016   SpO2 99%   BMI 24.62 kg/m   Physical Exam Physical Exam  Constitutional: Appears well-developed and well-nourished. No acute distress. HENT:  Head: Normocephalic.  Eyes: Conjunctivae are normal.  Cardiovascular: Normal rate and intact distal pulses.   +2 radial pulses Pulmonary/Chest: Effort normal. No respiratory distress.  Abdominal: Exhibits no distension.  Musculoskeletal: Limited range of motion due to right shoulder pain. Exhibits no deformity. There is tenderness to palpation of the right trapezius muscle extending to the shoulder.  Neurological: Alert. Fluent speech.  Skin: Skin is warm and dry.  intact innervation involving the radial, ulnar, median and axillary nerves of the right upper extremity  Psychiatric: Normal mood and affect. Behavior is normal.  Nursing note and vitals reviewed.   ED Treatments / Results  Labs (all labs  ordered are listed, but only abnormal results are displayed) Labs Reviewed - No data to display  EKG  EKG Interpretation None       Radiology Dg Shoulder Right  Result Date: 10/22/2016 CLINICAL DATA:  Right shoulder pain for 2 weeks without known injury. EXAM: RIGHT SHOULDER - 2+ VIEW COMPARISON:  None. FINDINGS: There is no evidence of fracture or dislocation. There is no evidence of arthropathy or other focal bone abnormality. Soft tissues are unremarkable. IMPRESSION: Normal right shoulder. Electronically Signed   By: Lupita RaiderJames  Green Jr, M.D.    On: 10/22/2016 14:33    Procedures Procedures (including critical care time)  Medications Ordered in ED Medications  ketorolac (TORADOL) 30 MG/ML injection 30 mg (30 mg Intramuscular Given 10/22/16 1430)     Initial Impression / Assessment and Plan / ED Course  I have reviewed the triage vital signs and the nursing notes.  Pertinent labs & imaging results that were available during my care of the patient were reviewed by me and considered in my medical decision making (see chart for details).     Presents with right shoulder pain after heavy lifting. Likely shoulder strain based on mechanism. X-ray visualized and shows no acute processes. Discussed continued supportive care with NSAIDs, heat packs, rest. Strict return and follow-up instructions reviewed. She and significant other expressed understanding of all discharge instructions and felt comfortable with the plan of care.   Final Clinical Impressions(s) / ED Diagnoses   Final diagnoses:  Acute pain of right shoulder    New Prescriptions New Prescriptions   METHOCARBAMOL (ROBAXIN) 500 MG TABLET    Take 1 tablet (500 mg total) by mouth every 8 (eight) hours as needed for muscle spasms.     Lavera GuiseLiu, Junita Kubota Duo, MD 10/22/16 [phone]

## 2016-10-22 NOTE — Discharge Instructions (Signed)
Your x-ray of the shoulder is normal.  Continue heat packs, ibuprofen. You can also take muscle relaxants.  Return for worsening symptoms, including fever, numbness/weakness of arm, or any other symptoms concerning to you.

## 2016-11-14 IMAGING — US US PELVIS COMPLETE
1 series · 13 of 25 positions shown · non-contrast
Comparison: None.

CLINICAL DATA: Post right lower quadrant suprapubic pain
intermittently for 6 months. White heavy vaginal discharge for 2-3
days. History of tubal ligation.

EXAM:
TRANSABDOMINAL AND TRANSVAGINAL ULTRASOUND OF PELVIS
DOPPLER ULTRASOUND OF OVARIES
TECHNIQUE: Both transabdominal and transvaginal ultrasound examinations of the
pelvis were performed. Transabdominal technique was performed for
global imaging of the pelvis including uterus, ovaries, adnexal
regions, and pelvic cul-de-sac.
It was necessary to proceed with endovaginal exam following the
transabdominal exam to visualize the uterus, endometrium and
ovaries. Color and duplex Doppler ultrasound was utilized to
evaluate blood flow to the ovaries.

[Series 1: us pelvis complete · 0.20mm/px · 13 of 109 slices shown]
[im 1/109]
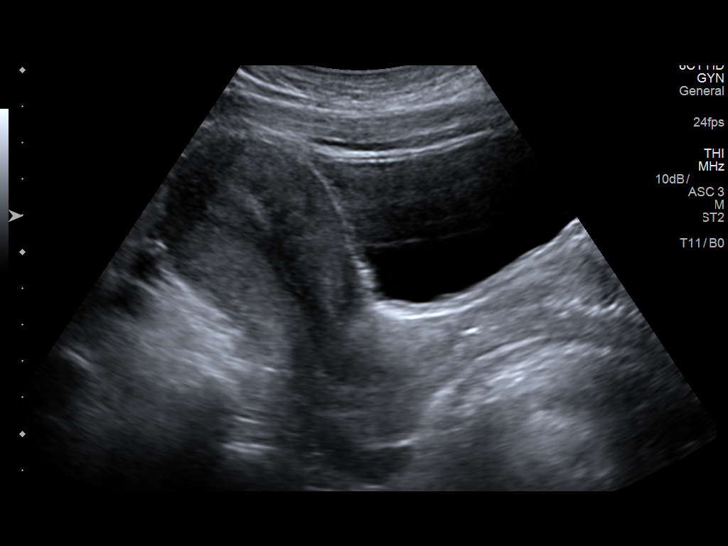
[im 10/109]
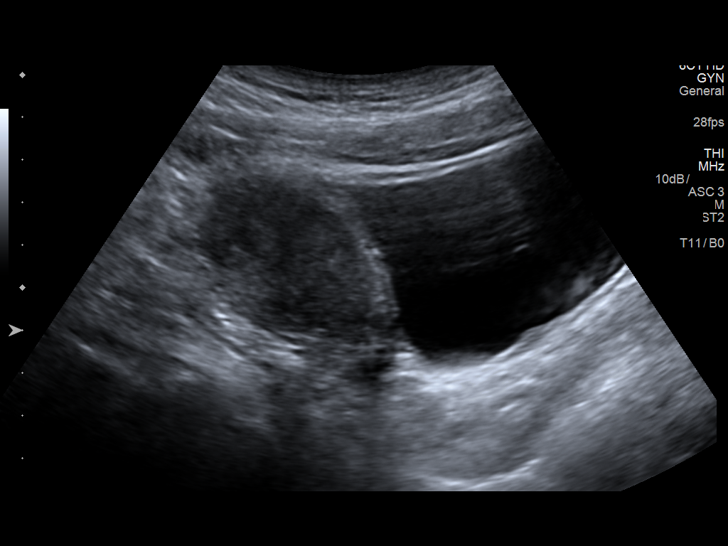
[im 19/109]
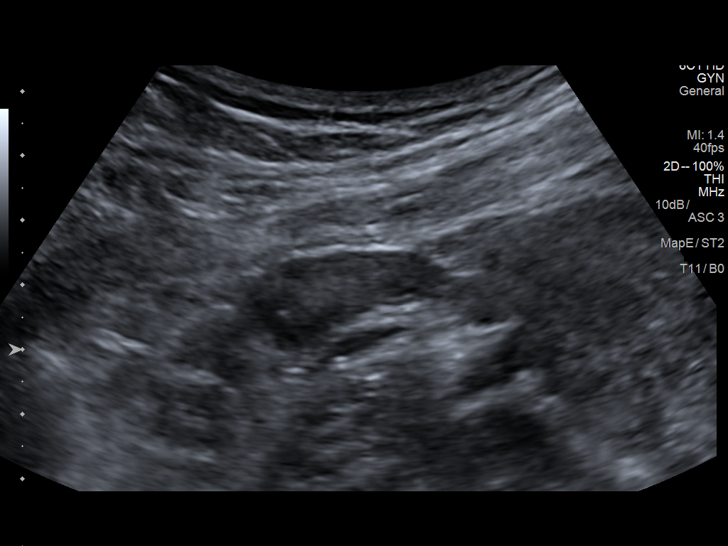
[im 28/109]
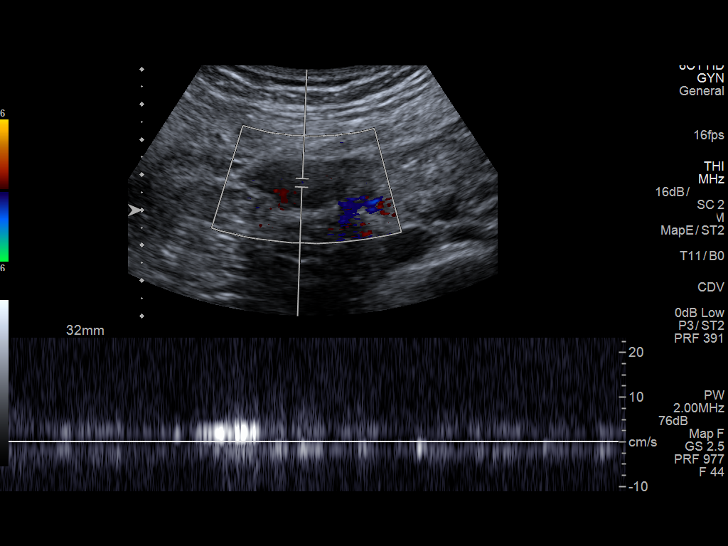
[im 37/109]
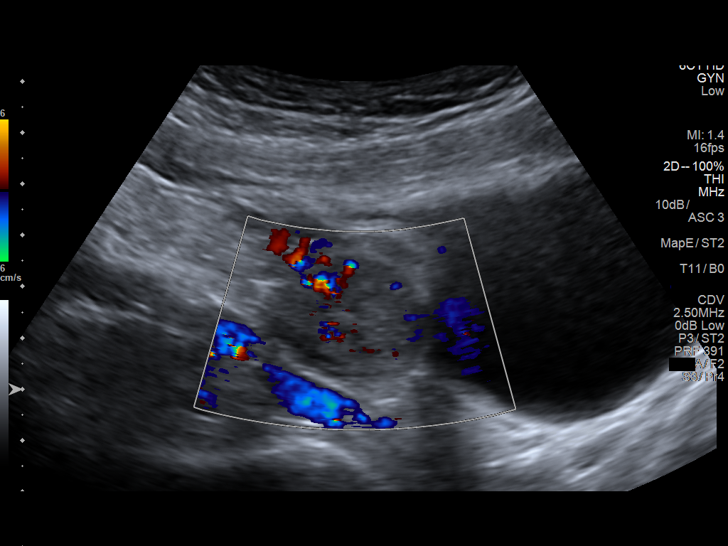
[im 46/109]
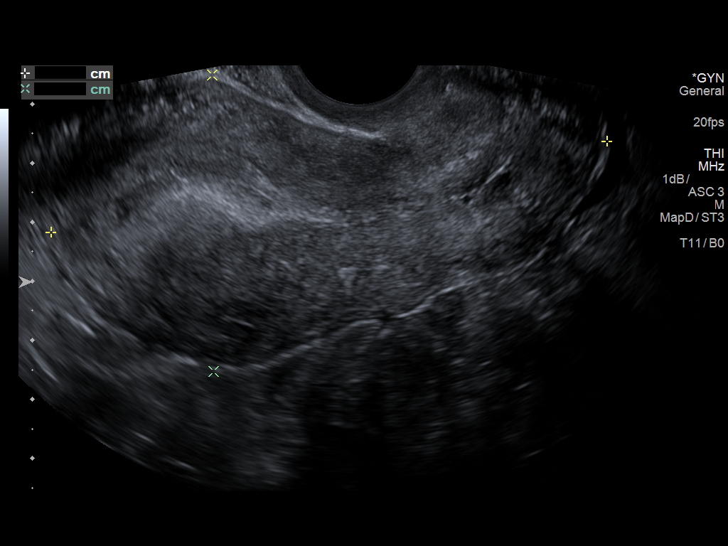
[im 55/109]
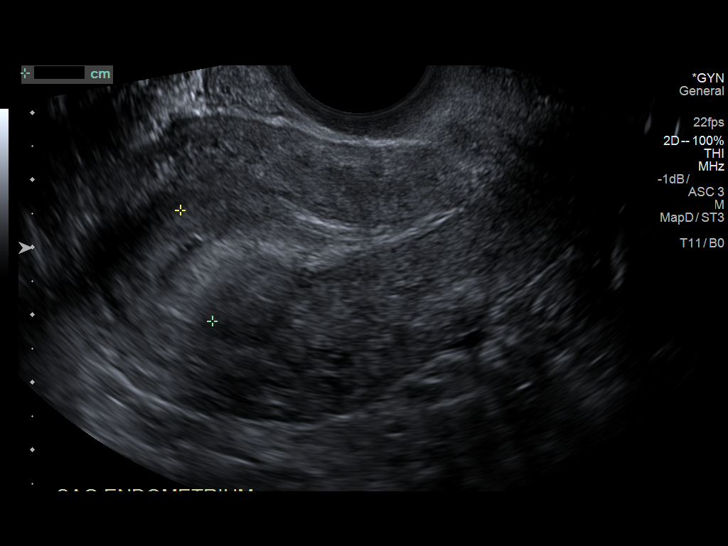
[im 64/109]
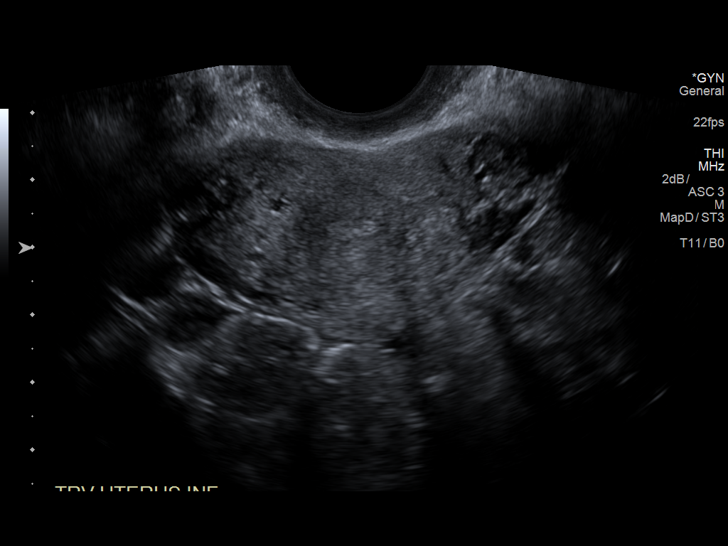
[im 73/109]
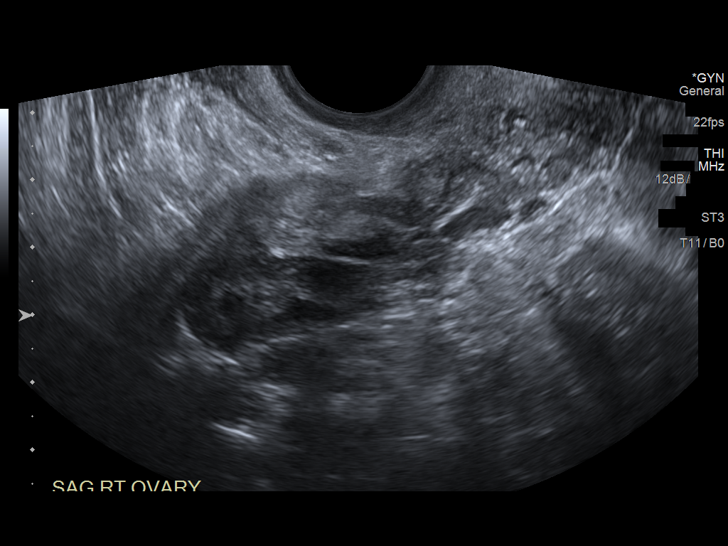
[im 82/109]
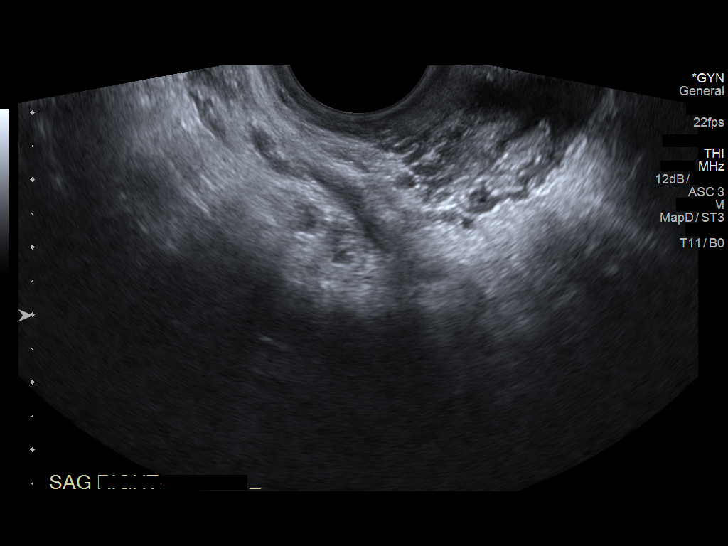
[im 91/109]
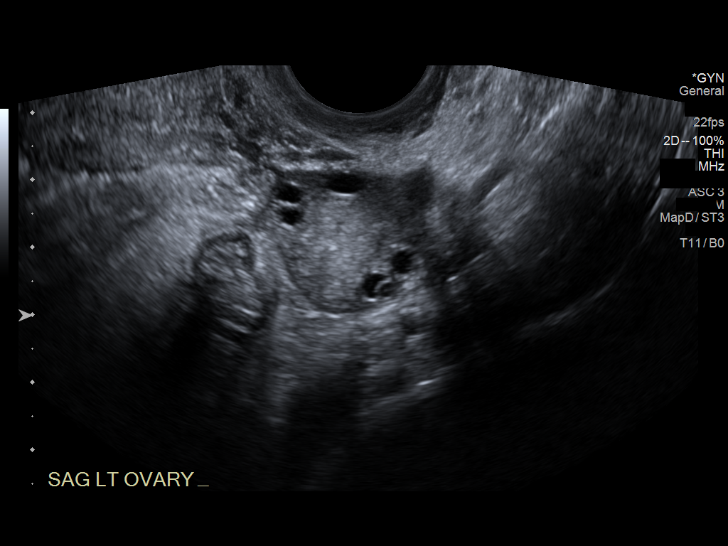
[im 100/109]
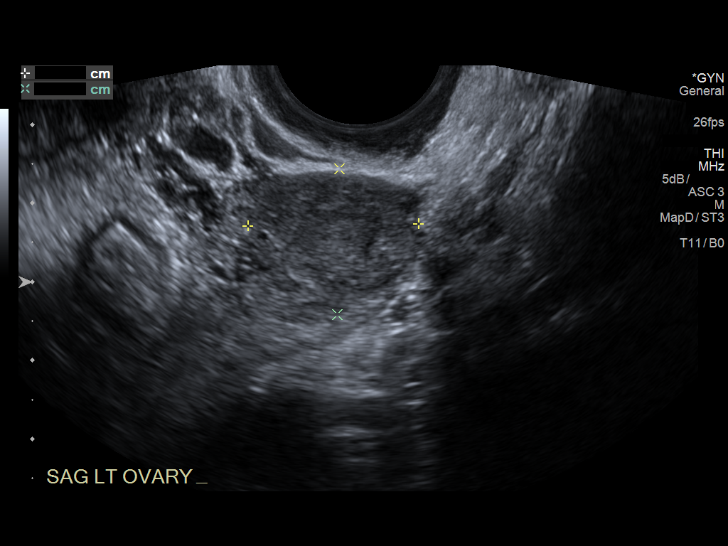
[im 109/109]
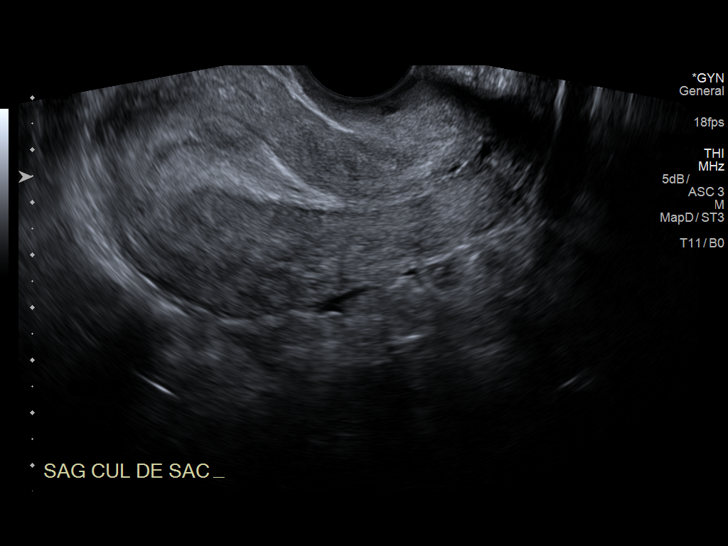

[13 of 25 positions shown; findings below may reference images not displayed]

FINDINGS: Uterus

Measurements: 9.5 x 5 x 6.8 cm. No fibroids or other mass
visualized.

Endometrium

Thickness: 17 mm.  No focal abnormality visualized.

Right ovary

Measurements: 3.4 x 1.4 x 2.4 cm. Normal appearance/no adnexal mass.

Left ovary

Measurements: 3.1 x 2.7 x 3.2 cm. 2.2 x all corpus luteal cyst
suggested.

Pulsed Doppler evaluation of both ovaries demonstrates normal
low-resistance arterial and venous waveforms.

Other findings

No abnormal free fluid.
IMPRESSION: Probable 2.2 x 1.9 x 2 cm corpus luteal cyst of the left ovary. No
sonographic findings for the patient's right-sided nor suprapubic
pain.

## 2017-07-01 ENCOUNTER — Encounter (HOSPITAL_BASED_OUTPATIENT_CLINIC_OR_DEPARTMENT_OTHER): Payer: Self-pay | Admitting: Emergency Medicine

## 2017-07-01 ENCOUNTER — Other Ambulatory Visit: Payer: Self-pay

## 2017-07-01 ENCOUNTER — Emergency Department (HOSPITAL_BASED_OUTPATIENT_CLINIC_OR_DEPARTMENT_OTHER)
Admission: EM | Admit: 2017-07-01 | Discharge: 2017-07-01 | Disposition: A | Discharge status: Home / Self Care | Payer: BLUE CROSS/BLUE SHIELD | Attending: Emergency Medicine | Admitting: Emergency Medicine

## 2017-07-01 ENCOUNTER — Emergency Department (HOSPITAL_BASED_OUTPATIENT_CLINIC_OR_DEPARTMENT_OTHER): Payer: BLUE CROSS/BLUE SHIELD

## 2017-07-01 DIAGNOSIS — J069 Acute upper respiratory infection, unspecified: Secondary | ICD-10-CM | POA: Diagnosis not present

## 2017-07-01 DIAGNOSIS — R05 Cough: Secondary | ICD-10-CM | POA: Diagnosis present

## 2017-07-01 DIAGNOSIS — B9789 Other viral agents as the cause of diseases classified elsewhere: Secondary | ICD-10-CM

## 2017-07-01 DIAGNOSIS — Z79899 Other long term (current) drug therapy: Secondary | ICD-10-CM | POA: Insufficient documentation

## 2017-07-01 MED ORDER — GUAIFENESIN 100 MG/5ML PO SOLN
10.0000 mL | Freq: Once | ORAL | Status: AC
  Administered 2017-07-01: 200 mg via ORAL
  Filled 2017-07-01: qty 10

## 2017-07-01 MED ORDER — IBUPROFEN 800 MG PO TABS: 800.0000 mg | ORAL_TABLET | Freq: Once | ORAL | Status: DC

## 2017-07-01 MED ORDER — IBUPROFEN 800 MG PO TABS
800.0000 mg | ORAL_TABLET | Freq: Once | ORAL | Status: AC
  Administered 2017-07-01: 800 mg via ORAL
  Filled 2017-07-01: qty 1

## 2017-07-01 MED ORDER — AMOXICILLIN 500 MG PO CAPS: 500.0000 mg | ORAL_CAPSULE | Freq: Once | ORAL | Status: DC

## 2017-07-01 NOTE — Discharge Instructions (Addendum)
You may alternate Tylenol 1000 mg every 6 hours as needed for pain and Ibuprofen 800 mg every 8 hours as needed for pain.  Please take Ibuprofen with food.   You may use over-the-counter Mucinex as needed for cough.  You may use over-the-counter Chloraseptic spray as needed for sore throat.  You do not need antibiotics today.

## 2017-07-01 NOTE — ED Notes (Signed)
Patient transported to X-ray 

## 2017-07-01 NOTE — ED Triage Notes (Addendum)
Patient presents with co cough and sore throat onset 2 days ago; denies fever; NAD noted; patient states taking nyquil and honey and lemon at home with minimal relief

## 2017-07-01 NOTE — ED Provider Notes (Signed)
TIME SEEN: 2:07 AM  CHIEF COMPLAINT: Cough  HPI: Patient is a 37 year old female with no significant past medical history who presents to the emergency department with 2 days of nonproductive cough, sore throat from coughing, green discharge from both eyes.  No fever.  No difficulty breathing.  No history of asthma.  No vomiting or diarrhea.  No sick contacts.  ROS: See HPI Constitutional: no fever  Eyes:  drainage  ENT: no runny nose   Cardiovascular:  no chest pain  Resp: no SOB  GI: no vomiting GU: no dysuria Integumentary: no rash  Allergy: no hives  Musculoskeletal: no leg swelling  Neurological: no slurred speech ROS otherwise negative  PAST MEDICAL HISTORY/PAST SURGICAL HISTORY:  History reviewed. No pertinent past medical history.  MEDICATIONS:  Prior to Admission medications   Medication Sig Start Date End Date Taking? Authorizing Provider  baclofen (LIORESAL) 10 MG tablet Take 1 tablet (10 mg total) by mouth 3 (three) times daily. 07/15/16   Arthor Captain, PA-C  ibuprofen (ADVIL,MOTRIN) 600 MG tablet Take 1 tablet (600 mg total) by mouth every 6 (six) hours as needed. 05/17/16   Maxwell Caul, PA-C  meloxicam (MOBIC) 15 MG tablet Take 1 tablet (15 mg total) by mouth daily. Take 1 daily with food. 07/15/16   Arthor Captain, PA-C  methocarbamol (ROBAXIN) 500 MG tablet Take 1 tablet (500 mg total) by mouth every 8 (eight) hours as needed for muscle spasms. 10/22/16   Lavera Guise, MD    ALLERGIES:  No Known Allergies  SOCIAL HISTORY:  Social History   Tobacco Use  . Smoking status: Never Smoker  . Smokeless tobacco: Never Used  Substance Use Topics  . Alcohol use: No    FAMILY HISTORY: History reviewed. No pertinent family history.  EXAM: BP 115/72 (BP Location: Right Arm)   Pulse (!) 105   Temp 98.2 F (36.8 C) (Oral)   Resp 18   Ht 5' (1.524 m)   Wt 63.5 kg (140 lb)   SpO2 100%   BMI 27.34 kg/m  CONSTITUTIONAL: Alert and oriented and responds  appropriately to questions. Well-appearing; well-nourished, afebrile, in no distress HEAD: Normocephalic EYES: Conjunctivae clear, pupils appear equal, EOMI, no drainage noted ENT: normal nose; moist mucous membranes; No pharyngeal erythema or petechiae, no tonsillar hypertrophy or exudate, no uvular deviation, no unilateral swelling, no trismus or drooling, no muffled voice, normal phonation, no stridor, no dental caries present, no drainable dental abscess noted, no Ludwig's angina, tongue sits flat in the bottom of the mouth, no angioedema, no facial erythema or warmth, no facial swelling; no pain with movement of the neck.   NECK: Supple, no meningismus, no nuchal rigidity, no LAD  CARD: RRR; S1 and S2 appreciated; no murmurs, no clicks, no rubs, no gallops RESP: Normal chest excursion without splinting or tachypnea; breath sounds clear and equal bilaterally; no wheezes, no rhonchi, no rales, no hypoxia or respiratory distress, speaking full sentences, continued dry cough on exam ABD/GI: Normal bowel sounds; non-distended; soft, non-tender, no rebound, no guarding, no peritoneal signs, no hepatosplenomegaly BACK:  The back appears normal and is non-tender to palpation, there is no CVA tenderness EXT: Normal ROM in all joints; non-tender to palpation; no edema; normal capillary refill; no cyanosis, no calf tenderness or swelling    SKIN: Normal color for age and race; warm; no rash NEURO: Moves all extremities equally PSYCH: The patient's mood and manner are appropriate. Grooming and personal hygiene are appropriate.  MEDICAL DECISION  MAKING: Patient here with likely viral illness but will obtain chest x-ray to rule out pneumonia given constant cough.  Will treat symptomatically with ibuprofen and guaifenesin.  Otherwise well-appearing.  No respiratory distress or hypoxia.  Doubt PE.  No sign of volume overload.  Doubt pneumothorax.  ED PROGRESS: Chest x-ray shows no acute abnormality.  Discussed  supportive care instructions and over-the-counter medications to help with symptomatic relief.  I feel she is safe to be discharged.  I do not feel she needs antibiotics at this time.   At this time, I do not feel there is any life-threatening condition present. I have reviewed and discussed all results (EKG, imaging, lab, urine as appropriate) and exam findings with patient/family. I have reviewed nursing notes and appropriate previous records.  I feel the patient is safe to be discharged home without further emergent workup and can continue workup as an outpatient as needed. Discussed usual and customary return precautions. Patient/family verbalize understanding and are comfortable with this plan.  Outpatient follow-up has been provided if needed. All questions have been answered.      Rhyland Hinderliter, Layla Maw, DO 07/01/17 [phone]

## 2018-02-25 ENCOUNTER — Emergency Department (HOSPITAL_BASED_OUTPATIENT_CLINIC_OR_DEPARTMENT_OTHER)
Admission: EM | Admit: 2018-02-25 | Discharge: 2018-02-26 | Disposition: A | Discharge status: Home / Self Care | Payer: BLUE CROSS/BLUE SHIELD | Attending: Emergency Medicine | Admitting: Emergency Medicine

## 2018-02-25 ENCOUNTER — Emergency Department (HOSPITAL_BASED_OUTPATIENT_CLINIC_OR_DEPARTMENT_OTHER): Payer: BLUE CROSS/BLUE SHIELD

## 2018-02-25 ENCOUNTER — Other Ambulatory Visit: Payer: Self-pay

## 2018-02-25 ENCOUNTER — Encounter (HOSPITAL_BASED_OUTPATIENT_CLINIC_OR_DEPARTMENT_OTHER): Payer: Self-pay | Admitting: Emergency Medicine

## 2018-02-25 DIAGNOSIS — R1031 Right lower quadrant pain: Secondary | ICD-10-CM | POA: Diagnosis not present

## 2018-02-25 DIAGNOSIS — Z79899 Other long term (current) drug therapy: Secondary | ICD-10-CM | POA: Insufficient documentation

## 2018-02-25 DIAGNOSIS — R52 Pain, unspecified: Secondary | ICD-10-CM

## 2018-02-25 LAB — CBC
HCT: 39.2 % (ref 36.0–46.0)
Hemoglobin: 12.4 g/dL (ref 12.0–15.0)
MCH: 28.8 pg (ref 26.0–34.0)
MCHC: 31.6 g/dL (ref 30.0–36.0)
MCV: 91.2 fL (ref 80.0–100.0)
Platelets: 393 10*3/uL (ref 150–400)
RBC: 4.3 MIL/uL (ref 3.87–5.11)
RDW: 12.8 % (ref 11.5–15.5)
WBC: 7.5 10*3/uL (ref 4.0–10.5)
nRBC: 0 % (ref 0.0–0.2)

## 2018-02-25 LAB — URINALYSIS, MICROSCOPIC (REFLEX): WBC, UA: NONE SEEN WBC/hpf (ref 0–5)

## 2018-02-25 LAB — HEPATIC FUNCTION PANEL
ALT: 19 U/L (ref 0–44)
AST: 20 U/L (ref 15–41)
Albumin: 3.8 g/dL (ref 3.5–5.0)
Alkaline Phosphatase: 70 U/L (ref 38–126)
Bilirubin, Direct: <0.1 mg/dL (ref 0.0–0.2)
Total Bilirubin: 0.3 mg/dL (ref 0.3–1.2)
Total Protein: 7.4 g/dL (ref 6.5–8.1)

## 2018-02-25 LAB — URINALYSIS, ROUTINE W REFLEX MICROSCOPIC
Bilirubin Urine: NEGATIVE
Glucose, UA: NEGATIVE mg/dL
Ketones, ur: NEGATIVE mg/dL
Leukocytes, UA: NEGATIVE
Nitrite: NEGATIVE
Protein, ur: NEGATIVE mg/dL
Specific Gravity, Urine: 1.01 (ref 1.005–1.030)
pH: 5.5 (ref 5.0–8.0)

## 2018-02-25 LAB — BASIC METABOLIC PANEL
Anion gap: 7 (ref 5–15)
BUN: 7 mg/dL (ref 6–20)
CO2: 25 mmol/L (ref 22–32)
Calcium: 8.8 mg/dL — ABNORMAL LOW (ref 8.9–10.3)
Chloride: 103 mmol/L (ref 98–111)
Creatinine, Ser: 0.57 mg/dL (ref 0.44–1.00)
GFR calc Af Amer: >60 mL/min (ref >60)
GFR calc non Af Amer: >60 mL/min (ref >60)
Glucose, Bld: 96 mg/dL (ref 70–99)
Potassium: 4.1 mmol/L (ref 3.5–5.1)
Sodium: 135 mmol/L (ref 135–145)

## 2018-02-25 LAB — TROPONIN I: Troponin I: <0.03 ng/mL (ref <0.03)

## 2018-02-25 LAB — LIPASE, BLOOD: Lipase: 30 U/L (ref 11–51)

## 2018-02-25 LAB — PREGNANCY, URINE: Preg Test, Ur: NEGATIVE

## 2018-02-25 MED ORDER — ONDANSETRON HCL 4 MG/2ML IJ SOLN
4.0000 mg | Freq: Once | INTRAMUSCULAR | Status: AC
  Administered 2018-02-25: 4 mg via INTRAVENOUS
  Filled 2018-02-25: qty 2

## 2018-02-25 MED ORDER — SODIUM CHLORIDE 0.9 % IV BOLUS
1000.0000 mL | Freq: Once | INTRAVENOUS | Status: AC
  Administered 2018-02-25: 1000 mL via INTRAVENOUS

## 2018-02-25 MED ORDER — IOPAMIDOL (ISOVUE-300) INJECTION 61%
100.0000 mL | Freq: Once | INTRAVENOUS | Status: AC | PRN
  Administered 2018-02-26: 100 mL via INTRAVENOUS

## 2018-02-25 MED ORDER — FENTANYL CITRATE (PF) 100 MCG/2ML IJ SOLN
50.0000 ug | Freq: Once | INTRAMUSCULAR | Status: AC
  Administered 2018-02-25: 50 ug via INTRAVENOUS
  Filled 2018-02-25: qty 2

## 2018-02-25 NOTE — ED Notes (Signed)
Patient transported to X-ray 

## 2018-02-25 NOTE — ED Triage Notes (Signed)
Patient states that she was cough and vomiting and then started to have SOB  - the patient reports that she is having pain to her upper abdominal area and lower abdominal area and chest

## 2018-02-25 NOTE — ED Provider Notes (Signed)
[facility] EMERGENCY DEPARTMENT Provider Note   CSN: [id number] Arrival date & time: 02/25/18  2109     History   Chief Complaint Chief Complaint  Patient presents with  . Abdominal Pain    HPI Paige Bruce is a 38 y.o. female.  Level 5 caveat for language barrier. Translator used. Patient here with right-sided lower abdominal pain for the past 3 days that progressively worsening.  Associated with nausea and vomiting.  Had 3 episodes of vomiting today.  Last bowel movement was today and normal.  No pain with urination or blood in the urine.  No vaginal bleeding or discharge.  Last menstrual period was last week.  She has never had this pain before. It is associated with poor appetite.  Previous cholecystectomy and tubal ligation.  She has some shortness of breath after coughing earlier but this has since resolved.  No chest pain or shortness of breath now.  No documented fever.    The history is provided by the patient and a relative. The history is limited by the condition of the patient and a language barrier.  Abdominal Pain  Associated symptoms: nausea and vomiting   Associated symptoms: no cough, no dysuria, no fever, no hematuria, no shortness of breath, no vaginal bleeding and no vaginal discharge     History reviewed. No pertinent past medical history.  There are no active problems to display for this patient.   Past Surgical History:  Procedure Laterality Date  . CHOLECYSTECTOMY    . TUBAL LIGATION       OB History   No obstetric history on file.      Home Medications    Prior to Admission medications   Medication Sig Start Date End Date Taking? Authorizing Provider  baclofen (LIORESAL) 10 MG tablet Take 1 tablet (10 mg total) by mouth 3 (three) times daily. 07/15/16   Arthor Captain, PA-C  ibuprofen (ADVIL,MOTRIN) 600 MG tablet Take 1 tablet (600 mg total) by mouth every 6 (six) hours as needed. 05/17/16   Maxwell Caul, PA-C  meloxicam  (MOBIC) 15 MG tablet Take 1 tablet (15 mg total) by mouth daily. Take 1 daily with food. 07/15/16   Arthor Captain, PA-C  methocarbamol (ROBAXIN) 500 MG tablet Take 1 tablet (500 mg total) by mouth every 8 (eight) hours as needed for muscle spasms. 10/22/16   Lavera Guise, MD    Family History History reviewed. No pertinent family history.  Social History Social History   Tobacco Use  . Smoking status: Never Smoker  . Smokeless tobacco: Never Used  Substance Use Topics  . Alcohol use: No  . Drug use: No     Allergies   Patient has no known allergies.   Review of Systems Review of Systems  Constitutional: Positive for activity change and appetite change. Negative for fever.  HENT: Negative for congestion and rhinorrhea.   Respiratory: Negative for cough, chest tightness and shortness of breath.   Gastrointestinal: Positive for abdominal pain, nausea and vomiting.  Genitourinary: Negative for dysuria, hematuria, vaginal bleeding and vaginal discharge.  Musculoskeletal: Negative for back pain.  Skin: Negative for rash.  Neurological: Negative for dizziness, facial asymmetry, weakness, light-headedness and numbness.   all other systems are negative except as noted in the HPI and PMH.     Physical Exam Updated Vital Signs BP 140/86 (BP Location: Right Arm)   Pulse 86   Temp 98.8 F (37.1 C) (Oral)   Resp 18   Ht  5' (1.524 m)   Wt 65.8 kg   LMP 02/18/2018   SpO2 98%   BMI 28.32 kg/m   Physical Exam Vitals signs and nursing note reviewed.  Constitutional:      General: She is not in acute distress.    Appearance: She is well-developed.  HENT:     Head: Normocephalic and atraumatic.     Mouth/Throat:     Pharynx: No oropharyngeal exudate.  Eyes:     Conjunctiva/sclera: Conjunctivae normal.     Pupils: Pupils are equal, round, and reactive to light.  Neck:     Musculoskeletal: Normal range of motion and neck supple.     Comments: No meningismus. Cardiovascular:       Rate and Rhythm: Normal rate and regular rhythm.     Heart sounds: Normal heart sounds. No murmur.  Pulmonary:     Effort: Pulmonary effort is normal. No respiratory distress.     Breath sounds: Normal breath sounds.  Abdominal:     Palpations: Abdomen is soft.     Tenderness: There is abdominal tenderness. There is guarding. There is no rebound.     Comments: Soft abdomen, right sided lower abdominal tenderness with voluntary guarding.  Genitourinary:    Comments: Chaperone present.  Normal external genitalia.  White discharge in vaginal vault.  No CMT.  No midline uterine tenderness.  Mild right-sided tenderness Musculoskeletal: Normal range of motion.        General: No tenderness.     Comments: No CVA tenderness  Skin:    General: Skin is warm.     Capillary Refill: Capillary refill takes less than 2 seconds.  Neurological:     Mental Status: She is alert and oriented to person, place, and time.     Cranial Nerves: No cranial nerve deficit.     Motor: No abnormal muscle tone.     Coordination: Coordination normal.     Comments:  5/5 strength throughout. CN 2-12 intact.Equal grip strength.   Psychiatric:        Behavior: Behavior normal.      ED Treatments / Results  Labs (all labs ordered are listed, but only abnormal results are displayed) Labs Reviewed  WET PREP, GENITAL - Abnormal; Notable for the following components:      Result Value   WBC, Wet Prep HPF POC MANY (*)    All other components within normal limits  BASIC METABOLIC PANEL - Abnormal; Notable for the following components:   Calcium 8.8 (*)    All other components within normal limits  URINALYSIS, ROUTINE W REFLEX MICROSCOPIC - Abnormal; Notable for the following components:   Hgb urine dipstick TRACE (*)    All other components within normal limits  URINALYSIS, MICROSCOPIC (REFLEX) - Abnormal; Notable for the following components:   Bacteria, UA RARE (*)    All other components within normal  limits  CBC  TROPONIN I  PREGNANCY, URINE  HEPATIC FUNCTION PANEL  LIPASE, BLOOD  D-DIMER, QUANTITATIVE (NOT AT [facility])  GC/CHLAMYDIA PROBE AMP ([place]) NOT AT [facility]    EKG None  Radiology Dg Chest 2 View  Addendum Date: 02/25/2018   ADDENDUM REPORT: 02/25/2018 23:41 ADDENDUM: The small metallic foreign bodies projected over the left chest on the initial images were located and removed. Repeat chest radiograph was obtained. Foreign bodies are no longer present. Lungs are clear. Surgical clips in the right upper quadrant. Electronically Signed   By: Burman Nieves M.D.   On: 02/25/2018 23:41  Result Date: 02/25/2018 CLINICAL DATA:  Right chest pain. EXAM: CHEST - 2 VIEW COMPARISON:  None. FINDINGS: Three small nails project over the lateral left chest on the frontal view and are noted to be in either the patient's clothing or soft tissues based on the lateral view. Tiny calcified nodule in the left lateral lung. No pneumothorax. The heart, hila, mediastinum, and lungs are otherwise normal. No other acute abnormalities. IMPRESSION: Three small nails project over the left chest. These are noted to be outside the lung base on the lateral view and could be within clothing on the patient or in the soft tissues based on today's study. Recommend clinical correlation. Electronically Signed: By: Gerome Sam III M.D On: 02/25/2018 22:21   Ct Abdomen Pelvis W Contrast  Result Date: 02/26/2018 CLINICAL DATA:  Right lower chest pain, upper abdominal pain, cough, nausea and vomiting, and shortness of breath for 1 day. EXAM: CT ABDOMEN AND PELVIS WITH CONTRAST TECHNIQUE: Multidetector CT imaging of the abdomen and pelvis was performed using the standard protocol following bolus administration of intravenous contrast. CONTRAST:  ISOVUE-300 IOPAMIDOL (ISOVUE-300) INJECTION 61% COMPARISON:  11/09/2014 FINDINGS: Lower chest: Lung bases are clear. Hepatobiliary: No focal liver abnormality is seen.  Known right lobe liver lesion seen on previous studies is not well demonstrated today. Status post cholecystectomy. No biliary dilatation. Pancreas: Unremarkable. No pancreatic ductal dilatation or surrounding inflammatory changes. Spleen: Normal in size without focal abnormality. Adrenals/Urinary Tract: Adrenal glands are unremarkable. Kidneys are normal, without renal calculi, focal lesion, or hydronephrosis. Bladder is unremarkable. Stomach/Bowel: Stomach is within normal limits. Appendix appears normal. No evidence of bowel wall thickening, distention, or inflammatory changes. Vascular/Lymphatic: No significant vascular findings are present. No enlarged abdominal or pelvic lymph nodes. Reproductive: Uterus and bilateral adnexa are unremarkable. Other: No abdominal wall hernia or abnormality. No abdominopelvic ascites. Musculoskeletal: No acute or significant osseous findings. IMPRESSION: No acute process demonstrated in the abdomen or pelvis. No evidence of bowel obstruction or inflammation. Appendix is normal. Electronically Signed   By: Burman Nieves M.D.   On: 02/26/2018 01:29    Procedures Procedures (including critical care time)  Medications Ordered in ED Medications  sodium chloride 0.9 % bolus 1,000 mL (has no administration in time range)  fentaNYL (SUBLIMAZE) injection 50 mcg (50 mcg Intravenous Given 02/25/18 2326)  ondansetron (ZOFRAN) injection 4 mg (4 mg Intravenous Given 02/25/18 2325)     Initial Impression / Assessment and Plan / ED Course  I have reviewed the triage vital signs and the nursing notes.  Pertinent labs & imaging results that were available during my care of the patient were reviewed by me and considered in my medical decision making (see chart for details).    Right-sided abdominal pain with decreased appetite and nausea and vomiting.  Previous cholecystectomy.  Foreign body seen on chest x-ray was outside the patient and was a piece of  jewelry.  Urinalysis negative.  hCG negative.  Labs are reassuring with no leukocytosis.  LFTs and lipase normal. CXR negative. D-dimer negative. Contrary to triage note, patient denies chest pain or shortness of breath.  CT scan obtained to evaluate for appendicitis.  CT scan shows normal appendix.  Pelvic exam as above with copious amounts of vaginal discharge.  Will treat empirically for PID with Rocephin and doxycycline.  Patient states she is only sexually active with her husband.  Work-up is reassuring.  Low suspicion for ovarian torsion with pain ongoing for the past 3 days.  Will  arrange for outpatient ultrasound to evaluate for tubo-ovarian abscess and treat with doxycycline for home. Adnexa appear normal on CT.  Follow-up with her PCP and gynecologist.  Return precautions discussed. Plan to have her return for ultrasound tomorrow.    Final Clinical Impressions(s) / ED Diagnoses   Final diagnoses:  Right lower quadrant abdominal pain    ED Discharge Orders    None       Eisley Barber, Jeannett SeniorStephen, MD 02/26/18 [phone]

## 2018-02-26 ENCOUNTER — Ambulatory Visit (HOSPITAL_BASED_OUTPATIENT_CLINIC_OR_DEPARTMENT_OTHER)
Admission: RE | Admit: 2018-02-26 | Discharge: 2018-02-26 | Disposition: A | Discharge status: Home / Self Care | Payer: BLUE CROSS/BLUE SHIELD | Source: Ambulatory Visit | Attending: Emergency Medicine | Admitting: Emergency Medicine

## 2018-02-26 DIAGNOSIS — R52 Pain, unspecified: Secondary | ICD-10-CM

## 2018-02-26 LAB — WET PREP, GENITAL
Clue Cells Wet Prep HPF POC: NONE SEEN
Sperm: NONE SEEN
Trich, Wet Prep: NONE SEEN
Yeast Wet Prep HPF POC: NONE SEEN

## 2018-02-26 LAB — D-DIMER, QUANTITATIVE: D-Dimer, Quant: <0.27 ug/mL-FEU (ref 0.00–0.50)

## 2018-02-26 MED ORDER — DOXYCYCLINE HYCLATE 100 MG PO CAPS: 100.0000 mg | ORAL_CAPSULE | Freq: Two times a day (BID) | ORAL | 0 refills | Status: DC

## 2018-02-26 MED ORDER — ONDANSETRON 4 MG PO TBDP: 4.0000 mg | ORAL_TABLET | Freq: Three times a day (TID) | ORAL | 0 refills | Status: DC | PRN

## 2018-02-26 MED ORDER — DOXYCYCLINE HYCLATE 100 MG PO TABS
100.0000 mg | ORAL_TABLET | Freq: Once | ORAL | Status: AC
  Administered 2018-02-26: 100 mg via ORAL
  Filled 2018-02-26: qty 1

## 2018-02-26 MED ORDER — CEFTRIAXONE SODIUM 250 MG IJ SOLR
250.0000 mg | Freq: Once | INTRAMUSCULAR | Status: AC
  Administered 2018-02-26: 250 mg via INTRAMUSCULAR
  Filled 2018-02-26: qty 250

## 2018-02-26 NOTE — ED Notes (Signed)
Patient transported to CT 

## 2018-02-26 NOTE — Discharge Instructions (Addendum)
Your CT scan shows a normal appendix.  Take the antibiotics for a possible pelvic inflammatory disease infection.  You should follow-up tomorrow for an ultrasound of your ovary to make sure there is no abscess or problem with the blood supply.  Return to the ED with new or worsening symptoms.

## 2018-02-27 LAB — GC/CHLAMYDIA PROBE AMP (~~LOC~~) NOT AT ARMC
Chlamydia: NEGATIVE
Neisseria Gonorrhea: NEGATIVE

## 2018-02-28 ENCOUNTER — Ambulatory Visit (HOSPITAL_BASED_OUTPATIENT_CLINIC_OR_DEPARTMENT_OTHER)
Admission: RE | Admit: 2018-02-28 | Discharge: 2018-02-28 | Disposition: A | Discharge status: Home / Self Care | Payer: BLUE CROSS/BLUE SHIELD | Source: Ambulatory Visit | Attending: Emergency Medicine | Admitting: Emergency Medicine

## 2018-02-28 ENCOUNTER — Other Ambulatory Visit (HOSPITAL_BASED_OUTPATIENT_CLINIC_OR_DEPARTMENT_OTHER): Payer: Self-pay | Admitting: Emergency Medicine

## 2018-02-28 DIAGNOSIS — R52 Pain, unspecified: Secondary | ICD-10-CM | POA: Diagnosis present

## 2018-02-28 DIAGNOSIS — R1031 Right lower quadrant pain: Secondary | ICD-10-CM

## 2018-09-22 ENCOUNTER — Emergency Department (HOSPITAL_BASED_OUTPATIENT_CLINIC_OR_DEPARTMENT_OTHER)
Admission: EM | Admit: 2018-09-22 | Discharge: 2018-09-22 | Disposition: A | Discharge status: Home / Self Care | Payer: BC Managed Care – PPO | Attending: Emergency Medicine | Admitting: Emergency Medicine

## 2018-09-22 ENCOUNTER — Encounter (HOSPITAL_BASED_OUTPATIENT_CLINIC_OR_DEPARTMENT_OTHER): Payer: Self-pay | Admitting: Emergency Medicine

## 2018-09-22 ENCOUNTER — Other Ambulatory Visit: Payer: Self-pay

## 2018-09-22 DIAGNOSIS — H9202 Otalgia, left ear: Secondary | ICD-10-CM

## 2018-09-22 DIAGNOSIS — Z79899 Other long term (current) drug therapy: Secondary | ICD-10-CM | POA: Diagnosis not present

## 2018-09-22 DIAGNOSIS — N898 Other specified noninflammatory disorders of vagina: Secondary | ICD-10-CM | POA: Insufficient documentation

## 2018-09-22 LAB — URINALYSIS, ROUTINE W REFLEX MICROSCOPIC
Bilirubin Urine: NEGATIVE
Glucose, UA: NEGATIVE mg/dL
Ketones, ur: NEGATIVE mg/dL
Nitrite: NEGATIVE
Protein, ur: NEGATIVE mg/dL
Specific Gravity, Urine: 1.025 (ref 1.005–1.030)
pH: 6 (ref 5.0–8.0)

## 2018-09-22 LAB — WET PREP, GENITAL
Clue Cells Wet Prep HPF POC: NONE SEEN
Sperm: NONE SEEN
Trich, Wet Prep: NONE SEEN
Yeast Wet Prep HPF POC: NONE SEEN

## 2018-09-22 LAB — PREGNANCY, URINE: Preg Test, Ur: NEGATIVE

## 2018-09-22 LAB — URINALYSIS, MICROSCOPIC (REFLEX)

## 2018-09-22 MED ORDER — FLUCONAZOLE 150 MG PO TABS
150.0000 mg | ORAL_TABLET | Freq: Once | ORAL | Status: AC
  Administered 2018-09-22: 150 mg via ORAL
  Filled 2018-09-22: qty 1

## 2018-09-22 MED ORDER — TRAMADOL HCL 50 MG PO TABS
100.0000 mg | ORAL_TABLET | Freq: Once | ORAL | Status: AC
  Administered 2018-09-22: 100 mg via ORAL
  Filled 2018-09-22: qty 2

## 2018-09-22 NOTE — ED Notes (Signed)
Pelvic cart at bedside. 

## 2018-09-22 NOTE — Discharge Instructions (Addendum)
You were seen in the emergency department for pain in your left ear along with vaginal discharge.  You were tested for gonorrhea and chlamydia and this test still pending at the time of discharge.  Your test for yeast and trichomonas were negative.  Because your symptoms started after antibiotics we will trial you with a dose of medication for a possible yeast infection.  Please follow-up with your doctor for further evaluation.

## 2018-09-22 NOTE — ED Notes (Signed)
ED Provider at bedside. 

## 2018-09-22 NOTE — ED Triage Notes (Signed)
Reports being treated for UTI recently but feels like she still has one as well as vaginal discharge.  Also c/o vaginal itching.  Also reports pain in left ear.  Seen on Thursday for the ear.  Also reports blood in urine.

## 2018-09-22 NOTE — ED Provider Notes (Signed)
[facility] EMERGENCY DEPARTMENT Provider Note   CSN: [id number] Arrival date & time: 09/22/18  2015     History   Chief Complaint Chief Complaint  Patient presents with  . Otalgia  . Vaginal Discharge  . Urinary Tract Infection    HPI Paige Bruce is a 38 y.o. female.  She is brought in by her significant other for evaluation of left ear pain that is been going on for months.  She seen her doctors for right under arm antibiotics and is waiting to follow-up with specialist.  She also is complaining of some green vaginal discharge that she thinks is related to her going on antibiotics for her ear.  They have also noticed some blood in her urine.  No fevers or chills no cough no chest pain or abdominal pain.     The history is provided by the patient. The history is limited by a language barrier. A language interpreter was used (family).  Otalgia Location:  Left Behind ear:  No abnormality Quality:  Throbbing Severity:  Severe Onset quality:  Gradual Timing:  Constant Progression:  Worsening Chronicity:  New Relieved by:  Nothing Worsened by:  Nothing Ineffective treatments:  OTC medications Associated symptoms: headaches   Associated symptoms: no abdominal pain, no cough, no diarrhea, no ear discharge, no fever, no neck pain, no rash, no rhinorrhea, no sore throat, no tinnitus and no vomiting   Vaginal Discharge Quality:  Green Severity:  Moderate Onset quality:  Gradual Timing:  Intermittent Progression:  Unchanged Chronicity:  New Relieved by:  None tried Worsened by:  Nothing Ineffective treatments:  None tried Associated symptoms: no abdominal pain, no dysuria, no fever and no vomiting   Urinary Tract Infection Associated symptoms: vaginal discharge   Associated symptoms: no abdominal pain, no fever and no vomiting     History reviewed. No pertinent past medical history.  There are no active problems to display for this patient.   Past Surgical  History:  Procedure Laterality Date  . CHOLECYSTECTOMY    . TUBAL LIGATION       OB History   No obstetric history on file.      Home Medications    Prior to Admission medications   Medication Sig Start Date End Date Taking? Authorizing Provider  baclofen (LIORESAL) 10 MG tablet Take 1 tablet (10 mg total) by mouth 3 (three) times daily. 07/15/16   Arthor CaptainHarris, Abigail, PA-C  doxycycline (VIBRAMYCIN) 100 MG capsule Take 1 capsule (100 mg total) by mouth 2 (two) times daily. 02/26/18   Rancour, Jeannett SeniorStephen, MD  ibuprofen (ADVIL,MOTRIN) 600 MG tablet Take 1 tablet (600 mg total) by mouth every 6 (six) hours as needed. 05/17/16   Maxwell CaulLayden, Lindsey A, PA-C  meloxicam (MOBIC) 15 MG tablet Take 1 tablet (15 mg total) by mouth daily. Take 1 daily with food. 07/15/16   Arthor CaptainHarris, Abigail, PA-C  methocarbamol (ROBAXIN) 500 MG tablet Take 1 tablet (500 mg total) by mouth every 8 (eight) hours as needed for muscle spasms. 10/22/16   Lavera GuiseLiu, Dana Duo, MD  ondansetron (ZOFRAN ODT) 4 MG disintegrating tablet Take 1 tablet (4 mg total) by mouth every 8 (eight) hours as needed for nausea or vomiting. 02/26/18   Glynn Octaveancour, Stephen, MD    Family History No family history on file.  Social History Social History   Tobacco Use  . Smoking status: Never Smoker  . Smokeless tobacco: Never Used  Substance Use Topics  . Alcohol use: No  . Drug use:  No     Allergies   Patient has no known allergies.   Review of Systems Review of Systems  Constitutional: Negative for fever.  HENT: Positive for ear pain. Negative for ear discharge, rhinorrhea, sore throat and tinnitus.   Eyes: Negative for visual disturbance.  Respiratory: Negative for cough and shortness of breath.   Cardiovascular: Negative for chest pain.  Gastrointestinal: Negative for abdominal pain, diarrhea and vomiting.  Genitourinary: Positive for vaginal discharge. Negative for dysuria.  Musculoskeletal: Negative for neck pain.  Skin: Negative for rash.   Neurological: Positive for headaches.     Physical Exam Updated Vital Signs BP (!) 133/94 (BP Location: Right Arm)   Pulse (!) 124   Temp 98.5 F (36.9 C) (Oral)   Resp 18   Ht 4\' 6"  (1.372 m)   Wt 64 kg   SpO2 100%   BMI 34.00 kg/m   Physical Exam Vitals signs and nursing note reviewed. Exam conducted with a chaperone present.  Constitutional:      General: She is not in acute distress.    Appearance: She is well-developed.  HENT:     Head: Normocephalic and atraumatic.     Right Ear: Tympanic membrane and ear canal normal.     Left Ear: Tympanic membrane and ear canal normal.     Nose: Nose normal.     Mouth/Throat:     Mouth: Mucous membranes are moist.     Pharynx: Oropharynx is clear.  Eyes:     Conjunctiva/sclera: Conjunctivae normal.  Neck:     Musculoskeletal: Neck supple.  Cardiovascular:     Rate and Rhythm: Regular rhythm. Tachycardia present.     Heart sounds: No murmur.  Pulmonary:     Effort: Pulmonary effort is normal. No respiratory distress.     Breath sounds: Normal breath sounds.  Abdominal:     Palpations: Abdomen is soft.     Tenderness: There is no abdominal tenderness.  Genitourinary:    General: Normal vulva.     Labia:        Right: No tenderness.        Left: No tenderness.      Vagina: Normal. No vaginal discharge or tenderness.     Cervix: Discharge (thick white/green) present.     Uterus: Normal.      Adnexa: Right adnexa normal and left adnexa normal.  Musculoskeletal: Normal range of motion.     Right lower leg: No edema.     Left lower leg: No edema.  Skin:    General: Skin is warm and dry.     Capillary Refill: Capillary refill takes less than 2 seconds.  Neurological:     General: No focal deficit present.     Mental Status: She is alert and oriented to person, place, and time.      ED Treatments / Results  Labs (all labs ordered are listed, but only abnormal results are displayed) Labs Reviewed  WET PREP, GENITAL  - Abnormal; Notable for the following components:      Result Value   WBC, Wet Prep HPF POC MANY (*)    All other components within normal limits  URINALYSIS, ROUTINE W REFLEX MICROSCOPIC - Abnormal; Notable for the following components:   Hgb urine dipstick SMALL (*)    Leukocytes,Ua TRACE (*)    All other components within normal limits  URINALYSIS, MICROSCOPIC (REFLEX) - Abnormal; Notable for the following components:   Bacteria, UA FEW (*)    All  other components within normal limits  PREGNANCY, URINE  GC/CHLAMYDIA PROBE AMP ([place]) NOT AT [facility]    EKG None  Radiology No results found.  Procedures Procedures (including critical care time)  Medications Ordered in ED Medications  traMADol (ULTRAM) tablet 100 mg (has no administration in time range)     Initial Impression / Assessment and Plan / ED Course  I have reviewed the triage vital signs and the nursing notes.  Pertinent labs & imaging results that were available during my care of the patient were reviewed by me and considered in my medical decision making (see chart for details).       38 year old female here with left ear pain that is been going on for months along with some new vaginal discharge and dysuria.  As far as the ear pain goes there is no obvious signs of ear infection but she does have some tenderness in front of the ear and and through the scalp so this could be TMJ or dental.  Her PCP already has her on antibiotics and I think that is what led to her having some vaginitis symptoms.  She supposed to be following up with an ENT for further evaluation of her ear.  Her pelvic exam was fairly unremarkable with no cervical motion tenderness or adnexal tenderness.  There was a small amount of thick whitish-green discharge.  It was negative for yeast or clue cells but in the setting of symptoms after Augmentin I think giving her a dose of Diflucan is warranted.  She is already on oral narcotics would not  prescribe her anything more for her ear pain and I explained this to her and she seemed understand.  She will follow-up with her primary care doctor or specialist for further evaluation of this but understands she can return if any worsening symptoms.  Final Clinical Impressions(s) / ED Diagnoses   Final diagnoses:  Otalgia of left ear  Vaginal discharge    ED Discharge Orders    None       Hayden Rasmussen, MD 09/22/18 2307

## 2018-09-24 LAB — GC/CHLAMYDIA PROBE AMP (~~LOC~~) NOT AT ARMC
Chlamydia: NEGATIVE
Neisseria Gonorrhea: NEGATIVE

## 2018-12-01 ENCOUNTER — Encounter (HOSPITAL_COMMUNITY): Payer: Self-pay | Admitting: Obstetrics and Gynecology

## 2018-12-01 ENCOUNTER — Emergency Department (HOSPITAL_COMMUNITY)
Admission: EM | Admit: 2018-12-01 | Discharge: 2018-12-02 | Disposition: A | Discharge status: Home / Self Care | Payer: BC Managed Care – PPO | Attending: Emergency Medicine | Admitting: Emergency Medicine

## 2018-12-01 ENCOUNTER — Other Ambulatory Visit: Payer: Self-pay

## 2018-12-01 DIAGNOSIS — R519 Headache, unspecified: Secondary | ICD-10-CM | POA: Insufficient documentation

## 2018-12-01 DIAGNOSIS — Z79899 Other long term (current) drug therapy: Secondary | ICD-10-CM | POA: Diagnosis not present

## 2018-12-01 MED ORDER — PROCHLORPERAZINE EDISYLATE 10 MG/2ML IJ SOLN
10.0000 mg | Freq: Once | INTRAMUSCULAR | Status: AC
  Administered 2018-12-01: 23:00:00 10 mg via INTRAVENOUS
  Filled 2018-12-01: qty 2

## 2018-12-01 MED ORDER — KETOROLAC TROMETHAMINE 30 MG/ML IJ SOLN
30.0000 mg | Freq: Once | INTRAMUSCULAR | Status: AC
  Administered 2018-12-01: 23:00:00 30 mg via INTRAVENOUS
  Filled 2018-12-01: qty 1

## 2018-12-01 MED ORDER — DIPHENHYDRAMINE HCL 50 MG/ML IJ SOLN
25.0000 mg | Freq: Once | INTRAMUSCULAR | Status: AC
  Administered 2018-12-01: 23:00:00 25 mg via INTRAVENOUS
  Filled 2018-12-01: qty 1

## 2018-12-01 MED ORDER — SODIUM CHLORIDE 0.9 % IV BOLUS
1000.0000 mL | Freq: Once | INTRAVENOUS | Status: AC
  Administered 2018-12-01: 1000 mL via INTRAVENOUS

## 2018-12-01 NOTE — ED Provider Notes (Signed)
[facility] DEPT Provider Note   CSN: [id number] Arrival date & time: 12/01/18  2034     History   Chief Complaint Chief Complaint  Patient presents with  . Headache    HPI Paige Bruce is a 38 y.o. female.     The history is provided by the patient and medical records. No language interpreter was used.  Migraine This is a recurrent problem. The current episode started 2 days ago. The problem occurs constantly. The problem has not changed since onset.Associated symptoms include headaches. Pertinent negatives include no chest pain, no abdominal pain and no shortness of breath. Exacerbated by: bright lights, loud noises. Nothing relieves the symptoms. She has tried acetaminophen for the symptoms. The treatment provided no relief.    History reviewed. No pertinent past medical history.  There are no active problems to display for this patient.   Past Surgical History:  Procedure Laterality Date  . CHOLECYSTECTOMY    . TUBAL LIGATION       OB History   No obstetric history on file.      Home Medications    Prior to Admission medications   Medication Sig Start Date End Date Taking? Authorizing Provider  baclofen (LIORESAL) 10 MG tablet Take 1 tablet (10 mg total) by mouth 3 (three) times daily. 07/15/16   Margarita Mail, PA-C  doxycycline (VIBRAMYCIN) 100 MG capsule Take 1 capsule (100 mg total) by mouth 2 (two) times daily. 02/26/18   Rancour, Annie Main, MD  ibuprofen (ADVIL,MOTRIN) 600 MG tablet Take 1 tablet (600 mg total) by mouth every 6 (six) hours as needed. 05/17/16   Volanda Napoleon, PA-C  meloxicam (MOBIC) 15 MG tablet Take 1 tablet (15 mg total) by mouth daily. Take 1 daily with food. 07/15/16   Margarita Mail, PA-C  methocarbamol (ROBAXIN) 500 MG tablet Take 1 tablet (500 mg total) by mouth every 8 (eight) hours as needed for muscle spasms. 10/22/16   Forde Dandy, MD  ondansetron (ZOFRAN ODT) 4 MG disintegrating tablet Take 1  tablet (4 mg total) by mouth every 8 (eight) hours as needed for nausea or vomiting. 02/26/18   Ezequiel Essex, MD    Family History No family history on file.  Social History Social History   Tobacco Use  . Smoking status: Never Smoker  . Smokeless tobacco: Never Used  Substance Use Topics  . Alcohol use: No  . Drug use: No     Allergies   Patient has no known allergies.   Review of Systems Review of Systems  Constitutional: Negative for chills, fatigue and fever.  HENT: Negative for congestion.   Eyes: Positive for photophobia. Negative for visual disturbance.  Respiratory: Negative for cough, chest tightness, shortness of breath and wheezing.   Cardiovascular: Negative for chest pain and palpitations.  Gastrointestinal: Positive for nausea. Negative for abdominal pain, constipation, diarrhea and vomiting.  Genitourinary: Negative for dysuria and flank pain.  Musculoskeletal: Negative for back pain, neck pain and neck stiffness.  Skin: Negative for rash and wound.  Neurological: Positive for headaches. Negative for dizziness, seizures, facial asymmetry, weakness, light-headedness and numbness.  Psychiatric/Behavioral: Negative for agitation and confusion.  All other systems reviewed and are negative.    Physical Exam Updated Vital Signs BP (!) 160/109 (BP Location: Right Arm)   Pulse (!) 123   Temp 98.1 F (36.7 C) (Oral)   Resp 16   Ht 4\' 11"  (1.499 m)   Wt 67.2 kg   LMP 10/31/2018  SpO2 100%   BMI 29.93 kg/m   Physical Exam Vitals signs and nursing note reviewed.  Constitutional:      General: She is not in acute distress.    Appearance: She is well-developed and normal weight. She is not ill-appearing, toxic-appearing or diaphoretic.  HENT:     Head: Normocephalic and atraumatic.     Right Ear: External ear normal.     Left Ear: External ear normal.     Nose: Nose normal.     Mouth/Throat:     Mouth: Mucous membranes are moist.     Pharynx: No  oropharyngeal exudate.  Eyes:     Extraocular Movements: Extraocular movements intact.     Conjunctiva/sclera: Conjunctivae normal.     Pupils: Pupils are equal, round, and reactive to light.  Neck:     Musculoskeletal: Normal range of motion and neck supple.  Cardiovascular:     Rate and Rhythm: Normal rate.     Heart sounds: No murmur.  Pulmonary:     Effort: No respiratory distress.     Breath sounds: No stridor. No wheezing, rhonchi or rales.  Chest:     Chest wall: No tenderness.  Abdominal:     General: There is no distension.     Palpations: Abdomen is soft.     Tenderness: There is no abdominal tenderness. There is no rebound.  Musculoskeletal:        General: No swelling or tenderness.  Skin:    General: Skin is warm.     Capillary Refill: Capillary refill takes less than 2 seconds.     Findings: No erythema or rash.  Neurological:     Mental Status: She is alert and oriented to person, place, and time.     GCS: GCS eye subscore is 4. GCS verbal subscore is 5. GCS motor subscore is 6.     Cranial Nerves: No dysarthria.     Sensory: No sensory deficit.     Motor: No weakness or abnormal muscle tone.     Coordination: Coordination normal.     Deep Tendon Reflexes: Reflexes are normal and symmetric.  Psychiatric:        Mood and Affect: Mood normal.      ED Treatments / Results  Labs (all labs ordered are listed, but only abnormal results are displayed) Labs Reviewed - No data to display  EKG None  Radiology No results found.  Procedures Procedures (including critical care time)  Medications Ordered in ED Medications  sodium chloride 0.9 % bolus 1,000 mL (0 mLs Intravenous Stopped 12/01/18 2338)  prochlorperazine (COMPAZINE) injection 10 mg (10 mg Intravenous Given 12/01/18 2244)  diphenhydrAMINE (BENADRYL) injection 25 mg (25 mg Intravenous Given 12/01/18 2245)  ketorolac (TORADOL) 30 MG/ML injection 30 mg (30 mg Intravenous Given 12/01/18 2244)      Initial Impression / Assessment and Plan / ED Course  I have reviewed the triage vital signs and the nursing notes.  Pertinent labs & imaging results that were available during my care of the patient were reviewed by me and considered in my medical decision making (see chart for details).        Paige Bruce is a 38 y.o. female with a past medical history significant for tubal ligation, prior cholecystectomy, and previous migraines who presents with headache.  She reports a several days she has had headache that has not been controlled with over-the-counter medications and Tylenol.  She reports some nausea but no vomiting.  She reports  photophobia and phonophobia.  She reports feels like prior migraine.  She denies any recent fevers, chills, vision changes or neurologic complaints.  She denies recent trauma.  No other complaints.  On exam, patient is moving all extremities with good strength and sensation.  Normal pupil exam.  Normal extraocular movements.  No facial droop.  No evidence of nuchal rigidity.  Lungs clear and chest and abdomen nontender.  Patient resting with the photophobia and phonophobia.  Had a discussion with the patient and her husband and we agreed to provide a headache cocktail which is helped her in the past.  At this time, low suspicion for a more acute intracranial abnormality such as hemorrhage requiring meningitis, or other problem at this time.  Anticipate reassessment after medications with plan to discharge if she is feeling better.  Care transferred to Dr. Clayborne DanaMesner while awaiting reassessment after headache cocktail.  Anticipate discharge if she is feeling better.     Final Clinical Impressions(s) / ED Diagnoses   Final diagnoses:  Acute nonintractable headache, unspecified headache type     Clinical Impression: 1. Acute nonintractable headache, unspecified headache type     Disposition: Care transferred to Dr. Clayborne DanaMesner while awaiting reassessment after  headache cocktail.  Anticipate discharge if she is feeling better.   This note was prepared with assistance of Conservation officer, historic buildingsDragon voice recognition software. Occasional wrong-word or sound-a-like substitutions may have occurred due to the inherent limitations of voice recognition software.      Tegeler, Canary Brimhristopher J, MD 12/02/18 0001

## 2018-12-01 NOTE — ED Triage Notes (Signed)
Pt reports headache with nausea. Patient states she has tried to control it with tylenol. Patient states she cannot go more than 2-3 hours without getting a headache. Pt reports light sensitivity.

## 2019-01-28 ENCOUNTER — Ambulatory Visit: Payer: BC Managed Care – PPO | Admitting: Neurology

## 2019-01-28 ENCOUNTER — Telehealth: Payer: Self-pay | Admitting: *Deleted

## 2019-01-28 ENCOUNTER — Encounter: Payer: Self-pay | Admitting: Neurology

## 2019-01-28 NOTE — Telephone Encounter (Signed)
No showed new patient appointment. 

## 2019-02-14 IMAGING — US US ART/VEN ABD/PELV/SCROTUM DOPPLER LTD
1 series · 14 of 25 positions shown · non-contrast
Comparison: None.

CLINICAL DATA: Right lower quadrant pain for 6-7 days.

EXAM:
TRANSABDOMINAL ULTRASOUND OF PELVIS
DOPPLER ULTRASOUND OF OVARIES
TECHNIQUE: Transabdominal ultrasound examination of the pelvis was performed
including evaluation of the uterus, ovaries, adnexal regions, and
pelvic cul-de-sac.
Color and duplex Doppler ultrasound was utilized to evaluate blood
flow to the ovaries.

[Series 1: us art/ven abd/pelv/scrotum doppler ltd · 0.24mm/px · 14 of 31 slices shown]
[im 1/31]
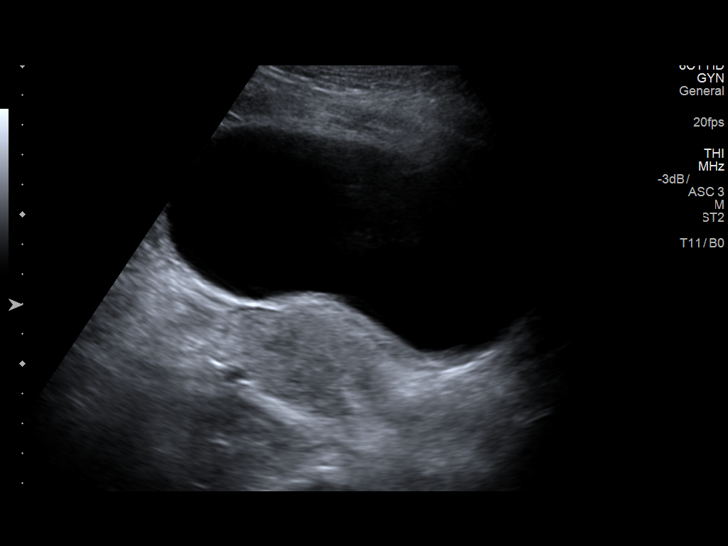
[im 3/31]
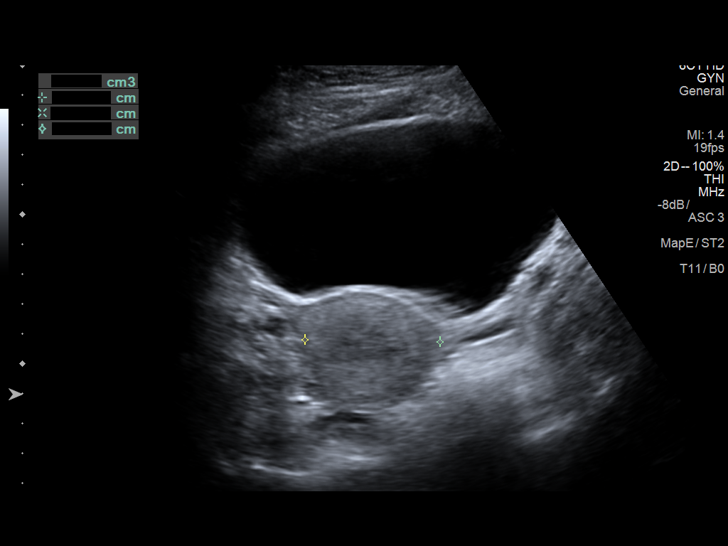
[im 6/31]
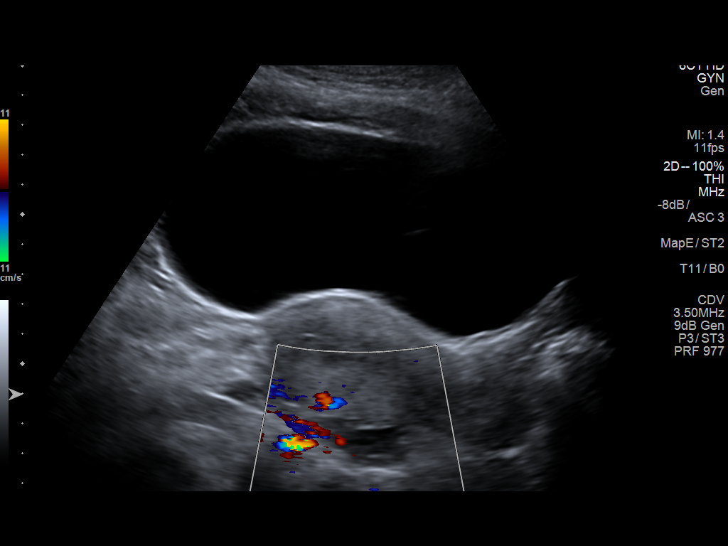
[im 8/31]
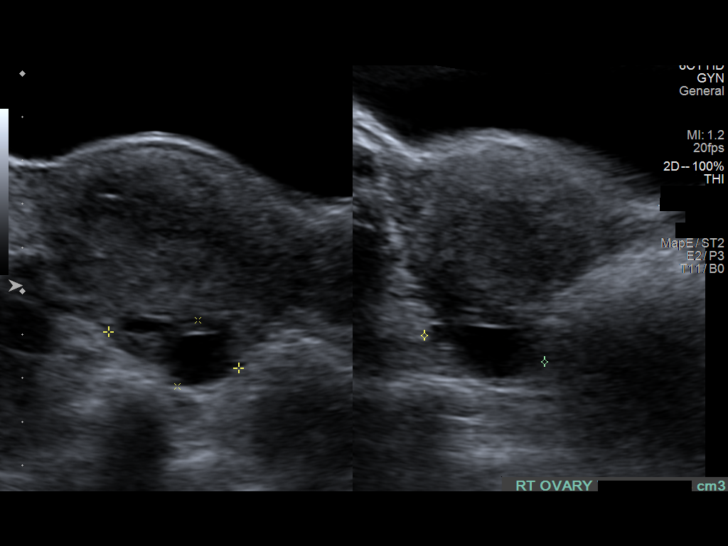
[im 11/31]
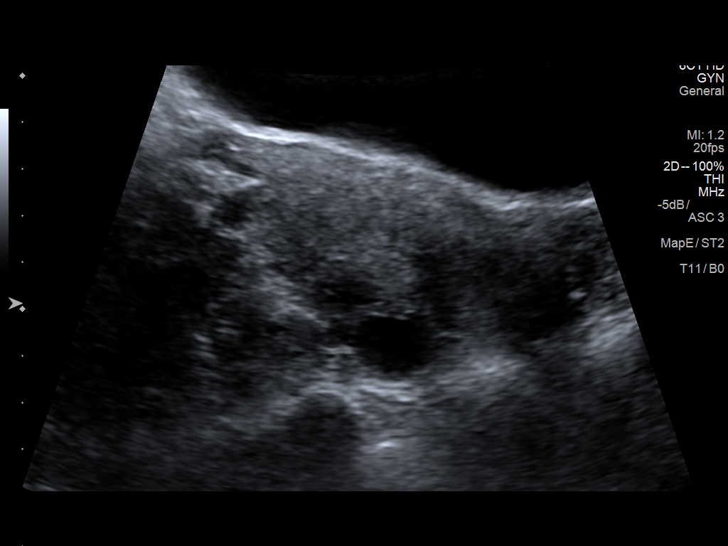
[im 12/31]
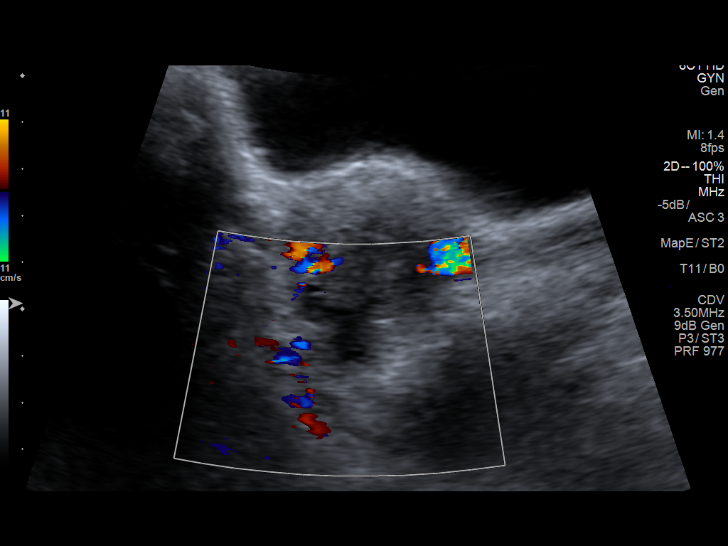
[im 14/31]
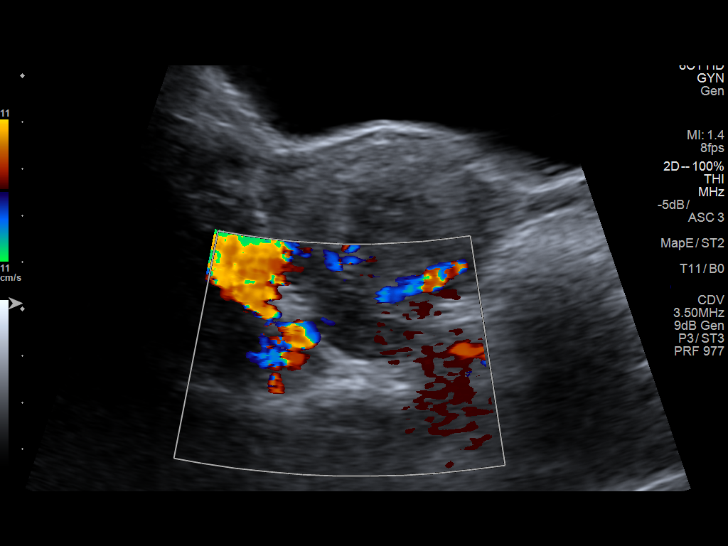
[im 17/31]
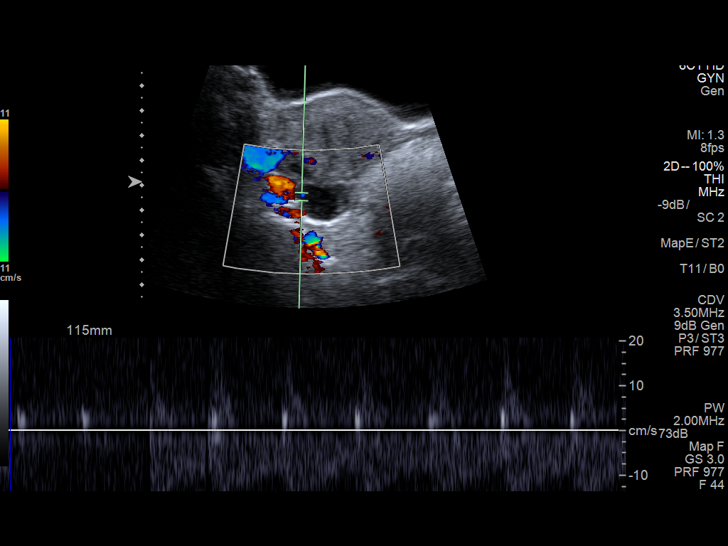
[im 19/31]
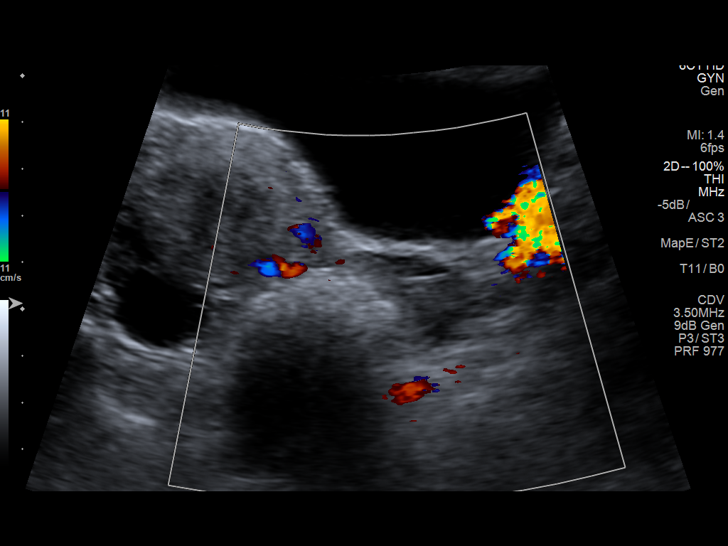
[im 21/31]
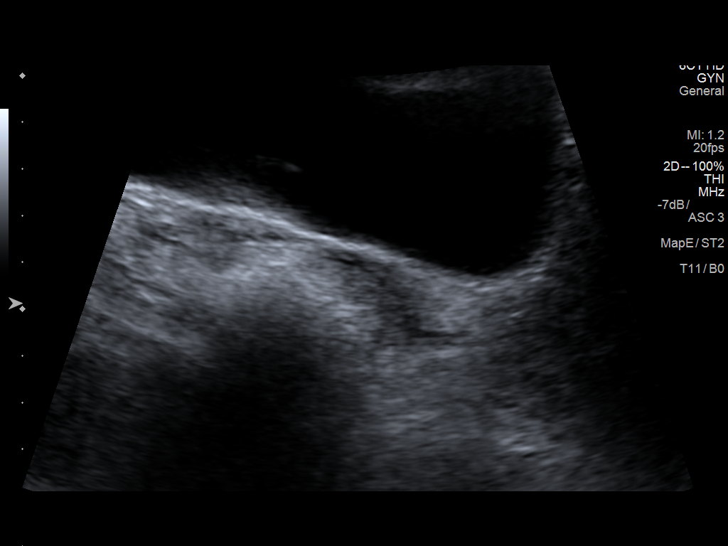
[im 23/31]
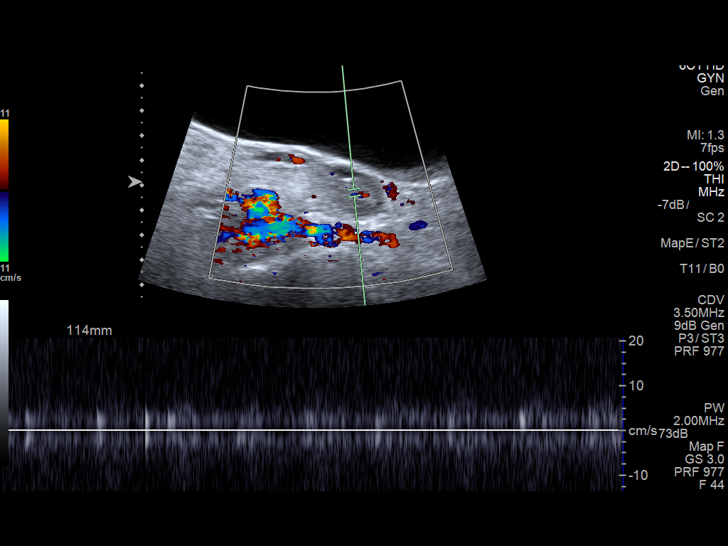
[im 26/31]
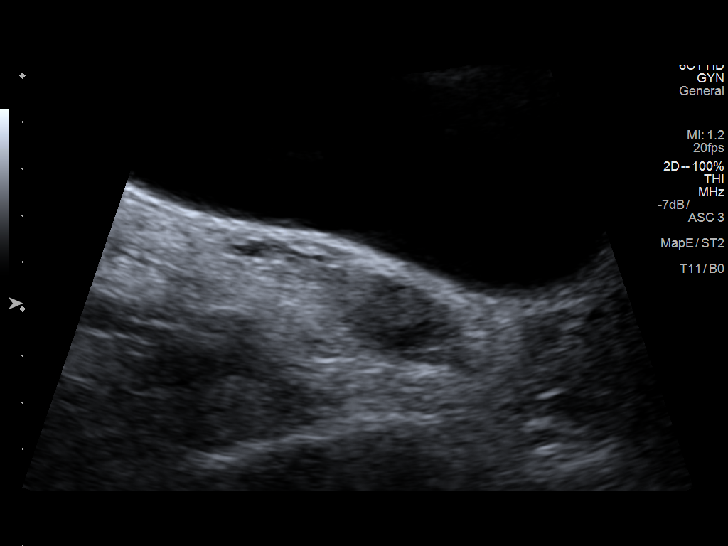
[im 28/31]
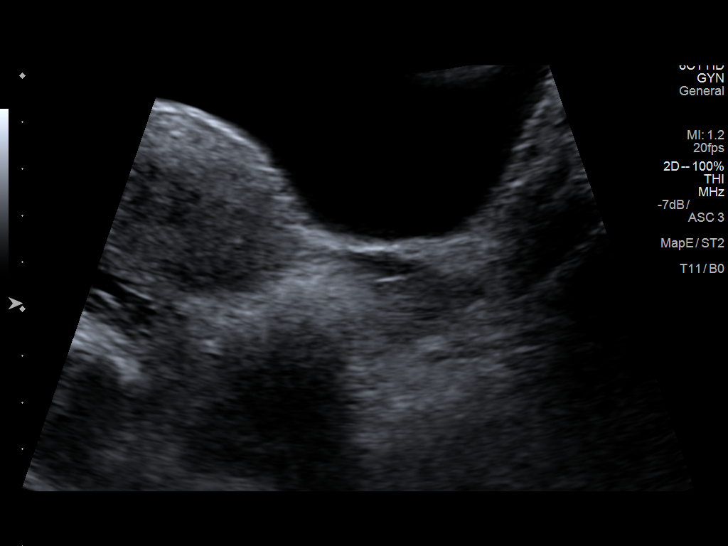
[im 31/31]
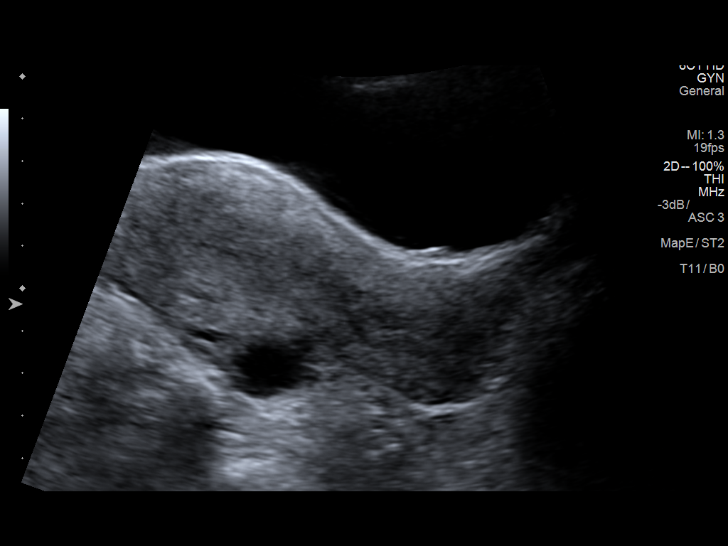

[14 of 25 positions shown; findings below may reference images not displayed]

FINDINGS: Uterus

Measurements: 9.2 x 4.2 x 4.5 cm = volume: 92 mL. No fibroids or
other mass visualized.

Endometrium

Thickness: 4 mm.  No focal abnormality visualized.

Right ovary

Measurements: 3.1 x 1.6 x 2.8 cm = volume: 7.3 mL. Normal
appearance/no adnexal mass. Small cyst right ovary.

Left ovary

Measurements: 3.5 x 1.9 x 2.0 cm = volume: 6.9 mL. Normal
appearance/no adnexal mass.

Pulsed Doppler evaluation demonstrates normal low-resistance
arterial and venous waveforms in both ovaries.

Other: None
IMPRESSION: No acute process within the pelvis. No sonographic evidence to
suggest torsion.

## 2019-06-10 ENCOUNTER — Ambulatory Visit (INDEPENDENT_AMBULATORY_CARE_PROVIDER_SITE_OTHER): Payer: BC Managed Care – PPO | Admitting: Otolaryngology

## 2019-06-26 ENCOUNTER — Encounter (HOSPITAL_COMMUNITY): Payer: Self-pay | Admitting: Emergency Medicine

## 2019-06-26 ENCOUNTER — Emergency Department (HOSPITAL_COMMUNITY)
Admission: EM | Admit: 2019-06-26 | Discharge: 2019-06-27 | Disposition: A | Discharge status: Home / Self Care | Payer: BC Managed Care – PPO | Attending: Emergency Medicine | Admitting: Emergency Medicine

## 2019-06-26 DIAGNOSIS — R112 Nausea with vomiting, unspecified: Secondary | ICD-10-CM | POA: Insufficient documentation

## 2019-06-26 DIAGNOSIS — R509 Fever, unspecified: Secondary | ICD-10-CM | POA: Diagnosis not present

## 2019-06-26 DIAGNOSIS — Z8616 Personal history of COVID-19: Secondary | ICD-10-CM | POA: Diagnosis not present

## 2019-06-26 DIAGNOSIS — R519 Headache, unspecified: Secondary | ICD-10-CM | POA: Diagnosis not present

## 2019-06-26 DIAGNOSIS — R197 Diarrhea, unspecified: Secondary | ICD-10-CM | POA: Diagnosis not present

## 2019-06-26 LAB — CBC
HCT: 46.1 % — ABNORMAL HIGH (ref 36.0–46.0)
Hemoglobin: 14.9 g/dL (ref 12.0–15.0)
MCH: 29.8 pg (ref 26.0–34.0)
MCHC: 32.3 g/dL (ref 30.0–36.0)
MCV: 92.2 fL (ref 80.0–100.0)
Platelets: 435 10*3/uL — ABNORMAL HIGH (ref 150–400)
RBC: 5 MIL/uL (ref 3.87–5.11)
RDW: 12.7 % (ref 11.5–15.5)
WBC: 19.8 10*3/uL — ABNORMAL HIGH (ref 4.0–10.5)
nRBC: 0 % (ref 0.0–0.2)

## 2019-06-26 LAB — COMPREHENSIVE METABOLIC PANEL
ALT: 31 U/L (ref 0–44)
AST: 25 U/L (ref 15–41)
Albumin: 4.3 g/dL (ref 3.5–5.0)
Alkaline Phosphatase: 94 U/L (ref 38–126)
Anion gap: 9 (ref 5–15)
BUN: 18 mg/dL (ref 6–20)
CO2: 19 mmol/L — ABNORMAL LOW (ref 22–32)
Calcium: 8.9 mg/dL (ref 8.9–10.3)
Chloride: 107 mmol/L (ref 98–111)
Creatinine, Ser: 0.84 mg/dL (ref 0.44–1.00)
GFR calc Af Amer: >60 mL/min (ref >60)
GFR calc non Af Amer: >60 mL/min (ref >60)
Glucose, Bld: 96 mg/dL (ref 70–99)
Potassium: 4.6 mmol/L (ref 3.5–5.1)
Sodium: 135 mmol/L (ref 135–145)
Total Bilirubin: 0.6 mg/dL (ref 0.3–1.2)
Total Protein: 8.1 g/dL (ref 6.5–8.1)

## 2019-06-26 LAB — URINALYSIS, ROUTINE W REFLEX MICROSCOPIC
Bilirubin Urine: NEGATIVE
Glucose, UA: NEGATIVE mg/dL
Ketones, ur: NEGATIVE mg/dL
Leukocytes,Ua: NEGATIVE
Nitrite: NEGATIVE
Protein, ur: 30 mg/dL — AB
Specific Gravity, Urine: 1.019 (ref 1.005–1.030)
pH: 5 (ref 5.0–8.0)

## 2019-06-26 LAB — LIPASE, BLOOD: Lipase: 27 U/L (ref 11–51)

## 2019-06-26 LAB — I-STAT BETA HCG BLOOD, ED (MC, WL, AP ONLY): I-stat hCG, quantitative: <5 m[IU]/mL (ref <5)

## 2019-06-26 MED ORDER — SODIUM CHLORIDE 0.9 % IV BOLUS
2000.0000 mL | Freq: Once | INTRAVENOUS | Status: AC
  Administered 2019-06-26: 2000 mL via INTRAVENOUS

## 2019-06-26 MED ORDER — ONDANSETRON HCL 4 MG/2ML IJ SOLN
4.0000 mg | Freq: Once | INTRAMUSCULAR | Status: AC
  Administered 2019-06-26: 4 mg via INTRAVENOUS
  Filled 2019-06-26: qty 2

## 2019-06-26 MED ORDER — SODIUM CHLORIDE 0.9% FLUSH: 3.0000 mL | Freq: Once | INTRAVENOUS | Status: DC

## 2019-06-26 NOTE — ED Provider Notes (Signed)
[facility] DEPT Provider Note   CSN: [id number] Arrival date & time: 06/26/19  1821     History Chief Complaint  Patient presents with  . Emesis  . Diarrhea  . Chills    Paige Bruce is a 39 y.o. female.  HPI She presents for evaluation of fever, chills, vomiting, diarrhea and headache.  She had a Covid test today but does not know the results, yet.  She previously had a Covid infection, several months ago.  She is exposed to a similar illness in a coworker about a week ago.  She has an IUD in place.  She works as a Glass blower/designer.  There are no other known modifying factors.  History reviewed. No pertinent past medical history.  There are no problems to display for this patient.   Past Surgical History:  Procedure Laterality Date  . CHOLECYSTECTOMY    . TUBAL LIGATION       OB History   No obstetric history on file.     No family history on file.  Social History   Tobacco Use  . Smoking status: Never Smoker  . Smokeless tobacco: Never Used  Substance Use Topics  . Alcohol use: No  . Drug use: No    Home Medications Prior to Admission medications   Medication Sig Start Date End Date Taking? Authorizing Provider  acetaminophen (TYLENOL) 500 MG tablet Take 1,000 mg by mouth every 6 (six) hours as needed for mild pain, moderate pain or headache.   Yes [provider]  ondansetron (ZOFRAN) 8 MG tablet Take 1 tablet (8 mg total) by mouth every 8 (eight) hours as needed for nausea or vomiting. 06/27/19   Daleen Bo, MD    Allergies    Patient has no known allergies.  Review of Systems   Review of Systems  All other systems reviewed and are negative.   Physical Exam Updated Vital Signs BP 118/77   Pulse 92   Temp 97.7 F (36.5 C)   Resp 14   SpO2 99%   Physical Exam Vitals and nursing note reviewed.  Constitutional:      General: She is not in acute distress.    Appearance: She is well-developed. She  is not ill-appearing, toxic-appearing or diaphoretic.  HENT:     Head: Normocephalic and atraumatic.     Nose: No congestion or rhinorrhea.     Mouth/Throat:     Mouth: Mucous membranes are moist.     Pharynx: No oropharyngeal exudate or posterior oropharyngeal erythema.  Eyes:     Conjunctiva/sclera: Conjunctivae normal.     Pupils: Pupils are equal, round, and reactive to light.  Neck:     Trachea: Phonation normal.  Cardiovascular:     Rate and Rhythm: Normal rate and regular rhythm.  Pulmonary:     Effort: Pulmonary effort is normal. No respiratory distress.     Breath sounds: Normal breath sounds. No stridor.  Chest:     Chest wall: No tenderness.  Abdominal:     General: There is no distension.     Palpations: Abdomen is soft.     Tenderness: There is no abdominal tenderness. There is no guarding.  Musculoskeletal:        General: Normal range of motion.     Cervical back: Normal range of motion and neck supple.  Skin:    General: Skin is warm and dry.  Neurological:     Mental Status: She is alert and oriented to  person, place, and time.     Motor: No abnormal muscle tone.  Psychiatric:        Mood and Affect: Mood normal.        Behavior: Behavior normal.        Thought Content: Thought content normal.        Judgment: Judgment normal.     ED Results / Procedures / Treatments   Labs (all labs ordered are listed, but only abnormal results are displayed) Labs Reviewed  COMPREHENSIVE METABOLIC PANEL - Abnormal; Notable for the following components:      Result Value   CO2 19 (*)    All other components within normal limits  CBC - Abnormal; Notable for the following components:   WBC 19.8 (*)    HCT 46.1 (*)    Platelets 435 (*)    All other components within normal limits  URINALYSIS, ROUTINE W REFLEX MICROSCOPIC - Abnormal; Notable for the following components:   APPearance HAZY (*)    Hgb urine dipstick SMALL (*)    Protein, ur 30 (*)    Bacteria, UA  RARE (*)    All other components within normal limits  LIPASE, BLOOD  I-STAT BETA HCG BLOOD, ED ([facility], [facility], [facility] ONLY)    EKG None  Radiology No results found.  Procedures Procedures (including critical care time)  Medications Ordered in ED Medications  sodium chloride flush (NS) 0.9 % injection 3 mL (has no administration in time range)  sodium chloride 0.9 % bolus 2,000 mL (2,000 mLs Intravenous New Bag/Given 06/26/19 2250)  ondansetron (ZOFRAN) injection 4 mg (4 mg Intravenous Given 06/26/19 2251)    ED Course  I have reviewed the triage vital signs and the nursing notes.  Pertinent labs & imaging results that were available during my care of the patient were reviewed by me and considered in my medical decision making (see chart for details).  Clinical Course as of Jun 27 107  Thu Jun 26, 2019  2230 Normal  I-Stat beta hCG blood, ED [EW]  2230 Normal  Lipase, blood [EW]  2230 Normal except white count high, hematocrit high, platelets high  CBC(!) [EW]  2230 Normal except presence of hemoglobin, protein, red cells and bacteria  Urinalysis, Routine w reflex microscopic(!) [EW]  2231 Normal except CO2 low  Comprehensive metabolic panel(!) [EW]    Clinical Course User Index [EW] Mancel Bale, MD   MDM Rules/Calculators/A&P                       Patient Vitals for the past 24 hrs:  BP Temp Temp src Pulse Resp SpO2  06/26/19 2300 118/77 -- -- 92 14 99 %  06/26/19 2215 117/73 -- -- 87 15 98 %  06/26/19 2126 120/74 97.7 F (36.5 C) -- 92 18 100 %  06/26/19 2124 120/74 97.7 F (36.5 C) -- -- 18 99 %  06/26/19 1840 93/75 97.7 F (36.5 C) Oral 100 18 100 %    1:08 AM Reevaluation with update and discussion. After initial assessment and treatment, an updated evaluation reveals no further complaints, she remains comfortable, findings discussed and questions answered. Mancel Bale   Medical Decision Making:  This patient is presenting for evaluation of nausea, vomiting  and diarrhea, which does require a range of treatment options, and is a complaint that involves a moderate risk of morbidity and mortality. The differential diagnoses include gastroenteritis, urinary tract infection, Covid infection, nonspecific viral infection. I decided to  review old records, and in summary healthy young adult, recently exposed to a gastroenteritis illness.  I obtain additional historical information from her significant other at the bedside.  Clinical Laboratory Tests Ordered, included CBC, metabolic panel, urinalysis, pregnancy test, lipase.  review indicates elevated white count, normal metabolic panel, normal urinalysis, pregnancy negative..   Critical Interventions-clinical evaluation, laboratory testing, IV fluids, reassessment after observation  After These Interventions, the Patient was reevaluated and was found stable for discharge.  CRITICAL CARE-no Performed by: Mancel Bale  Nursing Notes Reviewed/ Care Coordinated Applicable Imaging Reviewed Interpretation of Laboratory Data incorporated into ED treatment  The patient appears reasonably screened and/or stabilized for discharge and I doubt any other medical condition or other [facility] requiring further screening, evaluation, or treatment in the ED at this time prior to discharge.  Plan: Home Medications-OTC of choice; Home Treatments-gradual advance diet and activity; return here if the recommended treatment, does not improve the symptoms; Recommended follow up-PCP,.     Final Clinical Impression(s) / ED Diagnoses Final diagnoses:  Nausea vomiting and diarrhea    Rx / DC Orders ED Discharge Orders         Ordered    ondansetron (ZOFRAN) 8 MG tablet  Every 8 hours PRN     06/27/19 0059           Mancel Bale, MD 06/27/19 0109

## 2019-06-26 NOTE — ED Triage Notes (Addendum)
Per [facility] pt from home chills, weakness, fever, diarrhea, headache that started yesterday. Pt went for Covid test yesterday and waiting on results. Pt did vomit a couple times today.  Vitals: 98/79, 107/75 after NS , HR initially 110, came down to 93, CBG 97, Temp 97.3.  20g left AC

## 2019-06-27 MED ORDER — ONDANSETRON HCL 8 MG PO TABS
8.0000 mg | ORAL_TABLET | Freq: Three times a day (TID) | ORAL | 0 refills | Status: DC | PRN
Start: 2019-06-27 — End: 2020-05-22

## 2019-06-27 NOTE — Discharge Instructions (Addendum)
Start with clear liquids and gradually advance her diet to regular foods after a day or 2.  Use Tylenol if needed for pain or fever.

## 2020-05-21 ENCOUNTER — Encounter (HOSPITAL_COMMUNITY): Payer: Self-pay

## 2020-05-21 ENCOUNTER — Other Ambulatory Visit: Payer: Self-pay

## 2020-05-21 ENCOUNTER — Emergency Department (HOSPITAL_COMMUNITY): Payer: Medicaid Other

## 2020-05-21 ENCOUNTER — Emergency Department (HOSPITAL_COMMUNITY)
Admission: EM | Admit: 2020-05-21 | Discharge: 2020-05-22 | Disposition: A | Discharge status: Home / Self Care | Payer: Medicaid Other | Attending: Emergency Medicine | Admitting: Emergency Medicine

## 2020-05-21 DIAGNOSIS — Z28311 Partially vaccinated for covid-19: Secondary | ICD-10-CM | POA: Insufficient documentation

## 2020-05-21 DIAGNOSIS — R1114 Bilious vomiting: Secondary | ICD-10-CM | POA: Insufficient documentation

## 2020-05-21 DIAGNOSIS — R000 Tachycardia, unspecified: Secondary | ICD-10-CM | POA: Insufficient documentation

## 2020-05-21 DIAGNOSIS — R197 Diarrhea, unspecified: Secondary | ICD-10-CM | POA: Insufficient documentation

## 2020-05-21 DIAGNOSIS — E860 Dehydration: Secondary | ICD-10-CM | POA: Insufficient documentation

## 2020-05-21 DIAGNOSIS — R079 Chest pain, unspecified: Secondary | ICD-10-CM | POA: Insufficient documentation

## 2020-05-21 DIAGNOSIS — Z20822 Contact with and (suspected) exposure to covid-19: Secondary | ICD-10-CM | POA: Insufficient documentation

## 2020-05-21 DIAGNOSIS — J029 Acute pharyngitis, unspecified: Secondary | ICD-10-CM | POA: Insufficient documentation

## 2020-05-21 DIAGNOSIS — R059 Cough, unspecified: Secondary | ICD-10-CM

## 2020-05-21 DIAGNOSIS — N390 Urinary tract infection, site not specified: Secondary | ICD-10-CM

## 2020-05-21 LAB — COMPREHENSIVE METABOLIC PANEL
ALT: 37 U/L (ref 0–44)
AST: 34 U/L (ref 15–41)
Albumin: 4 g/dL (ref 3.5–5.0)
Alkaline Phosphatase: 111 U/L (ref 38–126)
Anion gap: 11 (ref 5–15)
BUN: 10 mg/dL (ref 6–20)
CO2: 24 mmol/L (ref 22–32)
Calcium: 9 mg/dL (ref 8.9–10.3)
Chloride: 102 mmol/L (ref 98–111)
Creatinine, Ser: 0.55 mg/dL (ref 0.44–1.00)
GFR, Estimated: >60 mL/min (ref >60)
Glucose, Bld: 104 mg/dL — ABNORMAL HIGH (ref 70–99)
Potassium: 3.3 mmol/L — ABNORMAL LOW (ref 3.5–5.1)
Sodium: 137 mmol/L (ref 135–145)
Total Bilirubin: 0.5 mg/dL (ref 0.3–1.2)
Total Protein: 8 g/dL (ref 6.5–8.1)

## 2020-05-21 LAB — CBC
HCT: 38.5 % (ref 36.0–46.0)
Hemoglobin: 12.8 g/dL (ref 12.0–15.0)
MCH: 29.3 pg (ref 26.0–34.0)
MCHC: 33.2 g/dL (ref 30.0–36.0)
MCV: 88.1 fL (ref 80.0–100.0)
Platelets: 356 10*3/uL (ref 150–400)
RBC: 4.37 MIL/uL (ref 3.87–5.11)
RDW: 13.5 % (ref 11.5–15.5)
WBC: 12.9 10*3/uL — ABNORMAL HIGH (ref 4.0–10.5)
nRBC: 0 % (ref 0.0–0.2)

## 2020-05-21 LAB — URINALYSIS, ROUTINE W REFLEX MICROSCOPIC
Bilirubin Urine: NEGATIVE
Glucose, UA: NEGATIVE mg/dL
Hgb urine dipstick: NEGATIVE
Ketones, ur: 20 mg/dL — AB
Nitrite: NEGATIVE
Protein, ur: NEGATIVE mg/dL
Specific Gravity, Urine: 1.026 (ref 1.005–1.030)
WBC, UA: >50 WBC/hpf — ABNORMAL HIGH (ref 0–5)
pH: 5 (ref 5.0–8.0)

## 2020-05-21 LAB — I-STAT BETA HCG BLOOD, ED (MC, WL, AP ONLY): I-stat hCG, quantitative: <5 m[IU]/mL (ref <5)

## 2020-05-21 LAB — LIPASE, BLOOD: Lipase: 29 U/L (ref 11–51)

## 2020-05-21 MED ORDER — SODIUM CHLORIDE 0.9 % IV SOLN
1.0000 g | Freq: Once | INTRAVENOUS | Status: AC
  Administered 2020-05-21: 1 g via INTRAVENOUS
  Filled 2020-05-21: qty 10

## 2020-05-21 MED ORDER — ONDANSETRON 4 MG PO TBDP
4.0000 mg | ORAL_TABLET | Freq: Once | ORAL | Status: AC | PRN
  Administered 2020-05-21: 4 mg via ORAL
  Filled 2020-05-21: qty 1

## 2020-05-21 MED ORDER — HYDROCOD POLST-CPM POLST ER 10-8 MG/5ML PO SUER
5.0000 mL | Freq: Once | ORAL | Status: AC
  Administered 2020-05-21: 5 mL via ORAL
  Filled 2020-05-21: qty 5

## 2020-05-21 MED ORDER — SODIUM CHLORIDE 0.9 % IV BOLUS
1000.0000 mL | Freq: Once | INTRAVENOUS | Status: AC
  Administered 2020-05-21: 1000 mL via INTRAVENOUS

## 2020-05-21 NOTE — ED Notes (Signed)
Patient requested her husband to translate for her in triage.

## 2020-05-21 NOTE — ED Triage Notes (Addendum)
Patient c/o upper abdominal pain, N/v/D, cough, and body aches since yesterday.

## 2020-05-21 NOTE — ED Provider Notes (Signed)
[facility]-EMERGENCY DEPT Provider Note   CSN: [id number] Arrival date & time: 05/21/20  1831     History Chief Complaint  Patient presents with  . Abdominal Pain  . Emesis  . Diarrhea  . Cough  . Generalized Body Aches    Paige Bruce is a 40 y.o. female.  HPI Patient presents with cough, nausea, sore throat.  She does have pain in her throat, and upper chest, though this latter is present with coughing, vomiting.  No abdominal pain other than when she is vomiting.  No diarrhea.  No fever. Pain is sore, again present when coughing and vomiting, otherwise otherwise just sore throat.  Patient was well prior to the onset of illness which was yesterday.  Since that time she has had the symptoms as well as diffuse myalgia. She has received 3 Covid vaccines.  Since onset she has been unable to take any medication for relief after initially taking Tylenol which did not change her symptoms.    History reviewed. No pertinent past medical history.  There are no problems to display for this patient.   Past Surgical History:  Procedure Laterality Date  . CHOLECYSTECTOMY    . TUBAL LIGATION       OB History   No obstetric history on file.     Family History  Problem Relation Age of Onset  . Diabetes Father   . Hypertension Father   . Cancer Father     Social History   Tobacco Use  . Smoking status: Never Smoker  . Smokeless tobacco: Never Used  Vaping Use  . Vaping Use: Never used  Substance Use Topics  . Alcohol use: No  . Drug use: No    Home Medications Prior to Admission medications   Medication Sig Start Date End Date Taking? Authorizing Provider  acetaminophen (TYLENOL) 500 MG tablet Take 1,000 mg by mouth every 6 (six) hours as needed for mild pain, moderate pain or headache.    [provider]  ondansetron (ZOFRAN) 8 MG tablet Take 1 tablet (8 mg total) by mouth every 8 (eight) hours as needed for nausea or vomiting. 06/27/19    Mancel Bale, MD    Allergies    Patient has no known allergies.  Review of Systems   Review of Systems  Constitutional:       Per HPI, otherwise negative  HENT:       Per HPI, otherwise negative  Respiratory:       Per HPI, otherwise negative  Cardiovascular:       Per HPI, otherwise negative  Gastrointestinal: Positive for nausea and vomiting.  Endocrine:       Negative aside from HPI  Genitourinary:       Neg aside from HPI   Musculoskeletal:       Per HPI, otherwise negative  Skin: Negative.   Neurological: Negative for syncope.    Physical Exam Updated Vital Signs BP (!) 148/99 (BP Location: Right Arm)   Pulse (!) 113   Temp 98.4 F (36.9 C) (Oral)   Resp 15   SpO2 100%   Physical Exam Vitals and nursing note reviewed.  Constitutional:      General: She is not in acute distress.    Appearance: She is well-developed.  HENT:     Head: Normocephalic and atraumatic.     Mouth/Throat:   Eyes:     Conjunctiva/sclera: Conjunctivae normal.  Cardiovascular:     Rate and Rhythm: Regular rhythm.  Tachycardia present.  Pulmonary:     Effort: Pulmonary effort is normal. No respiratory distress.     Breath sounds: Normal breath sounds. No stridor.  Abdominal:     General: There is no distension.     Tenderness: There is no abdominal tenderness. There is no guarding or rebound.  Skin:    General: Skin is warm and dry.  Neurological:     Mental Status: She is alert and oriented to person, place, and time.     Cranial Nerves: No cranial nerve deficit.     ED Results / Procedures / Treatments   Labs (all labs ordered are listed, but only abnormal results are displayed) Labs Reviewed  COMPREHENSIVE METABOLIC PANEL - Abnormal; Notable for the following components:      Result Value   Potassium 3.3 (*)    Glucose, Bld 104 (*)    All other components within normal limits  CBC - Abnormal; Notable for the following components:   WBC 12.9 (*)    All other  components within normal limits  URINALYSIS, ROUTINE W REFLEX MICROSCOPIC - Abnormal; Notable for the following components:   APPearance HAZY (*)    Ketones, ur 20 (*)    Leukocytes,Ua LARGE (*)    WBC, UA >50 (*)    Bacteria, UA RARE (*)    All other components within normal limits  SARS CORONAVIRUS 2 (HOSPITAL ORDER, PERFORMED IN [facility])  LIPASE, BLOOD  I-STAT BETA HCG BLOOD, ED ([facility], [facility], [facility] ONLY)  POC SARS CORONAVIRUS 2 AG -  ED    EKG None  Radiology DG Chest Portable 1 View  Result Date: 05/21/2020 CLINICAL DATA:  Upper abdominal pain and cough EXAM: PORTABLE CHEST 1 VIEW COMPARISON:  02/25/2018 FINDINGS: Small calcified granuloma in the left mid lung. No focal opacity or pleural effusion. Normal heart size. No pneumothorax IMPRESSION: No active disease. Electronically Signed   By: Jasmine Pang M.D.   On: 05/21/2020 19:22    Procedures Procedures   Medications Ordered in ED Medications  cefTRIAXone (ROCEPHIN) 1 g in sodium chloride 0.9 % 100 mL IVPB (has no administration in time range)  ondansetron (ZOFRAN-ODT) disintegrating tablet 4 mg (4 mg Oral Given 05/21/20 1846)  sodium chloride 0.9 % bolus 1,000 mL (1,000 mLs Intravenous New Bag/Given 05/21/20 2214)  chlorpheniramine-HYDROcodone (TUSSIONEX) 10-8 MG/5ML suspension 5 mL (5 mLs Oral Given 05/21/20 2214)    ED Course  I have reviewed the triage vital signs and the nursing notes.  Pertinent labs & imaging results that were available during my care of the patient were reviewed by me and considered in my medical decision making (see chart for details).    11:03 PM Patient calm, no additional vomiting. Initial labs notable for evidence for dehydration with ketonuria.  Patient just now starting IV fluid resuscitation, antiemetics, cough medication.  With consideration of UTI, given her leukocytes, patient will receive ceftriaxone.  Covid test sent, will be available tomorrow. X-ray reviewed, no evidence for  pneumonia, nor is she hypoxic.  Adult female presents with 1 day of nausea, vomiting, sore throat, discomfort with coughing and vomiting, but otherwise no pain.  Patient is afebrile, but found to have leukocytosis, as well as leukocytes in her urine.  Patient began fluid resuscitation, antibiotics, antiemetics, will require repeat evaluation. Dr. Eudelia Bunch is aware of her.  Final Clinical Impression(s) / ED Diagnoses Final diagnoses:  Bilious vomiting with nausea  Cough  Dehydration     Gerhard Munch, MD  05/21/20 2305  

## 2020-05-21 NOTE — ED Triage Notes (Signed)
Emergency Medicine Provider Triage Evaluation Note  Aneita Kiger , a 40 y.o. female  was evaluated in triage.  Pt complains of 4-day history of upper abdominal pain, nausea, vomiting, diarrhea and cough.  Reports aching throughout her entire body.  Reports sick contacts with cough but no GI symptoms.  Minimal improvement noted with Mucinex.  Denies possibility of pregnancy.  She has had a cholecystectomy.  Review of Systems  Positive: Nausea, vomiting, diarrhea, cough Negative: Chest pain  Physical Exam  BP (!) 148/99 (BP Location: Right Arm)   Pulse (!) 113   Temp 98.4 F (36.9 C) (Oral)   Resp 15   SpO2 100%  Gen:   Awake, no distress   HEENT:  Atraumatic  Resp:  Normal effort  Cardiac:  Normal rate  Abd:   Nondistended, tenderness palpation of the epigastric and right upper quadrant MSK:   Moves extremities without difficulty  Neuro:  Speech clear   Medical Decision Making  Medically screening exam initiated at 6:49 PM.  Appropriate orders placed.  Tyla Burgner was informed that the remainder of the evaluation will be completed by another provider, this initial triage assessment does not replace that evaluation, and the importance of remaining in the ED until their evaluation is complete.  Clinical Impression  40 year old female presenting to the ED for abdominal pain, nausea, vomiting, diarrhea and cough.  Reports myalgias as well.  She is right upper quadrant epigastric tenderness.  She is status post cholecystectomy.  Will order lab work.   Dietrich Pates, PA-C 05/21/20 1903

## 2020-05-22 LAB — SARS CORONAVIRUS 2 BY RT PCR (HOSPITAL ORDER, PERFORMED IN ~~LOC~~ HOSPITAL LAB): SARS Coronavirus 2: NEGATIVE

## 2020-05-22 MED ORDER — ONDANSETRON 4 MG PO TBDP: 4.0000 mg | ORAL_TABLET | Freq: Three times a day (TID) | ORAL | 0 refills | Status: AC | PRN

## 2020-05-22 MED ORDER — CEPHALEXIN 500 MG PO CAPS: 500.0000 mg | ORAL_CAPSULE | Freq: Three times a day (TID) | ORAL | 0 refills | Status: AC

## 2020-05-22 NOTE — ED Provider Notes (Signed)
I assumed care of this patient.  Please see previous provider note for further details of Hx, PE.  Briefly patient is a 40 y.o. female who presented N/V and abd pain.  Work up notable for UTI. Already treated with rocephin. Plan for IVF and po challenge.  Tolerated PO. Feels better.  The patient appears reasonably screened and/or stabilized for discharge and I doubt any other medical condition or other [facility] requiring further screening, evaluation, or treatment in the ED at this time prior to discharge. Safe for discharge with strict return precautions.  Disposition: Discharge  Condition: Good  I have discussed the results, Dx and Tx plan with the patient/family who expressed understanding and agree(s) with the plan. Discharge instructions discussed at length. The patient/family was given strict return precautions who verbalized understanding of the instructions. No further questions at time of discharge.    ED Discharge Orders         Ordered    ondansetron (ZOFRAN ODT) 4 MG disintegrating tablet  Every 8 hours PRN        05/22/20 0141    cephALEXin (KEFLEX) 500 MG capsule  3 times daily        05/22/20 0141                Arcelia Pals, Amadeo Garnet, MD 05/22/20 0145

## 2020-05-22 NOTE — ED Notes (Signed)
Discharged no concerns at this time.  °

## 2023-02-23 ENCOUNTER — Ambulatory Visit
Admission: EM | Admit: 2023-02-23 | Discharge: 2023-02-23 | Disposition: A | Discharge status: Home / Self Care | Payer: Medicaid Other | Attending: Physician Assistant | Admitting: Physician Assistant

## 2023-02-23 DIAGNOSIS — R112 Nausea with vomiting, unspecified: Secondary | ICD-10-CM | POA: Diagnosis not present

## 2023-02-23 MED ORDER — ONDANSETRON 4 MG PO TBDP: 4.0000 mg | ORAL_TABLET | Freq: Once | ORAL | Status: DC

## 2023-02-23 MED ORDER — ONDANSETRON 8 MG PO TBDP: 8.0000 mg | ORAL_TABLET | Freq: Three times a day (TID) | ORAL | 0 refills | Status: DC | PRN

## 2023-02-23 NOTE — Discharge Instructions (Addendum)
 Beba pequeos volmenes de lquidos claros durante todo [address], como agua, [place], Gatorade diluido con agua o caldo de carne de res, pollo o verduras Recomendar la dieta BRAT  Drink small volumes of clear liquids all day long, such as water, Pedialyte, Gatorade diluted with water, or beef, chicken, or vegetable broth Recommend BRAT diet

## 2023-02-23 NOTE — ED Triage Notes (Addendum)
 Pt presents to UC for c/o vomiting, fatigue, abd cramping x2 days Able to drink small amounts of liquid. States she has fatigue from walking short distance. Back pain d/t vomiting.  Interpreter Oneita Jolly [phone]

## 2023-02-23 NOTE — ED Provider Notes (Signed)
 [facility]    CSN: [id number] Arrival date & time: 02/23/23  1301      History   Chief Complaint Chief Complaint  Patient presents with   Emesis    HPI Paige Bruce is a 43 y.o. female.   Patient here for evaluation of vomiting x 1 day.  She states she's having trouble keeping food / fluids down due to the vomiting.  Denies f/c, URI sx, cough, wheezing, SOB, diarrhea.  She has some abdominal cramping.  No sick contacts, no recent travel.    History reviewed. No pertinent past medical history.  There are no active problems to display for this patient.   Past Surgical History:  Procedure Laterality Date   CHOLECYSTECTOMY     TUBAL LIGATION      OB History   No obstetric history on file.      Home Medications    Prior to Admission medications   Medication Sig Start Date End Date Taking? Authorizing Provider  ondansetron  (ZOFRAN -ODT) 8 MG disintegrating tablet Take 1 tablet (8 mg total) by mouth every 8 (eight) hours as needed for nausea or vomiting. 02/23/23  Yes Juleen Rush, PA-C  acetaminophen  (TYLENOL ) 500 MG tablet Take 1,000 mg by mouth every 6 (six) hours as needed for mild pain, moderate pain or headache.    [provider]    Family History Family History  Problem Relation Age of Onset   Diabetes Father    Hypertension Father    Cancer Father     Social History Social History   Tobacco Use   Smoking status: Never   Smokeless tobacco: Never  Vaping Use   Vaping status: Never Used  Substance Use Topics   Alcohol use: No   Drug use: No     Allergies   Patient has no known allergies.   Review of Systems Review of Systems  Constitutional:  Negative for chills, fatigue and fever.  HENT:  Negative for congestion, ear pain, nosebleeds, postnasal drip, rhinorrhea, sinus pressure, sinus pain and sore throat.   Eyes:  Negative for pain and redness.  Respiratory:  Negative for cough, shortness of breath and wheezing.    Gastrointestinal:  Positive for abdominal pain, nausea and vomiting. Negative for abdominal distention, blood in stool, constipation and diarrhea.  Musculoskeletal:  Negative for arthralgias and myalgias.  Skin:  Negative for rash.  Neurological:  Negative for light-headedness and headaches.  Hematological:  Negative for adenopathy. Does not bruise/bleed easily.  Psychiatric/Behavioral:  Negative for confusion and sleep disturbance.      Physical Exam Triage Vital Signs ED Triage Vitals  Encounter Vitals Group     BP 02/23/23 1323 119/81     Systolic BP Percentile --      Diastolic BP Percentile --      Pulse Rate 02/23/23 1323 (!) 102     Resp 02/23/23 1323 16     Temp 02/23/23 1323 98.6 F (37 C)     Temp Source 02/23/23 1323 Oral     SpO2 02/23/23 1323 97 %     Weight --      Height --      Head Circumference --      Peak Flow --      Pain Score 02/23/23 1316 0     Pain Loc --      Pain Education --      Exclude from Growth Chart --    No data found.  Updated Vital Signs BP  119/81 (BP Location: Right Arm)   Pulse (!) 102   Temp 98.6 F (37 C) (Oral)   Resp 16   LMP 01/31/2023 (Approximate)   SpO2 97%   Visual Acuity Right Eye Distance:   Left Eye Distance:   Bilateral Distance:    Right Eye Near:   Left Eye Near:    Bilateral Near:     Physical Exam Vitals and nursing note reviewed.  Constitutional:      General: She is not in acute distress.    Appearance: Normal appearance. She is not ill-appearing.  HENT:     Head: Normocephalic and atraumatic.  Eyes:     General: No scleral icterus.    Extraocular Movements: Extraocular movements intact.     Conjunctiva/sclera: Conjunctivae normal.  Cardiovascular:     Rate and Rhythm: Normal rate and regular rhythm.     Heart sounds: No murmur heard. Pulmonary:     Effort: Pulmonary effort is normal. No respiratory distress.     Breath sounds: Normal breath sounds. No wheezing or rales.  Abdominal:      General: There is no distension.     Tenderness: There is abdominal tenderness (mild, R side periumbicial area) in the periumbilical area. There is no right CVA tenderness, left CVA tenderness, guarding or rebound. Negative signs include Murphy's sign, Rovsing's sign, McBurney's sign, psoas sign and obturator sign.  Musculoskeletal:     Cervical back: Normal range of motion. No rigidity.  Lymphadenopathy:     Cervical: No cervical adenopathy.  Skin:    Coloration: Skin is not jaundiced.     Findings: No rash.  Neurological:     General: No focal deficit present.     Mental Status: She is alert and oriented to person, place, and time.     Motor: No weakness.     Gait: Gait normal.  Psychiatric:        Mood and Affect: Mood normal.        Behavior: Behavior normal.      UC Treatments / Results  Labs (all labs ordered are listed, but only abnormal results are displayed) Labs Reviewed - No data to display  EKG   Radiology No results found.  Procedures Procedures (including critical care time)  Medications Ordered in UC Medications  ondansetron  (ZOFRAN -ODT) disintegrating tablet 4 mg (has no administration in time range)    Initial Impression / Assessment and Plan / UC Course  I have reviewed the triage vital signs and the nursing notes.  Pertinent labs & imaging results that were available during my care of the patient were reviewed by me and considered in my medical decision making (see chart for details).     Mild abdominal tenderness, no guarding Abdomen soft, non-acute Strict ED precautions provided - go to ED if you're unable to keep fluids down despite treatment Drink small volume of fluids all day long Advance diet as tolerated Final Clinical Impressions(s) / UC Diagnoses   Final diagnoses:  Nausea and vomiting, unspecified vomiting type     Discharge Instructions      Beba pequeos volmenes de lquidos claros durante todo [address], como agua,  [place], Gatorade diluido con agua o caldo de carne de res, pollo o verduras Recomendar la dieta BRAT  Drink small volumes of clear liquids all day long, such as water, Pedialyte, Gatorade diluted with water, or beef, chicken, or vegetable broth Recommend BRAT diet    ED Prescriptions     Medication Sig Dispense  Auth. Provider   ondansetron  (ZOFRAN -ODT) 8 MG disintegrating tablet Take 1 tablet (8 mg total) by mouth every 8 (eight) hours as needed for nausea or vomiting. 12 tablet Juleen Rush, PA-C      PDMP not reviewed this encounter.   Juleen Rush, PA-C 02/23/23 1349

## 2023-06-22 ENCOUNTER — Emergency Department (HOSPITAL_COMMUNITY)
Admission: EM | Admit: 2023-06-22 | Discharge: 2023-06-23 | Disposition: A | Discharge status: Home / Self Care | Attending: Emergency Medicine | Admitting: Emergency Medicine

## 2023-06-22 ENCOUNTER — Encounter (HOSPITAL_COMMUNITY): Payer: Self-pay | Admitting: *Deleted

## 2023-06-22 ENCOUNTER — Other Ambulatory Visit: Payer: Self-pay

## 2023-06-22 DIAGNOSIS — D72829 Elevated white blood cell count, unspecified: Secondary | ICD-10-CM | POA: Insufficient documentation

## 2023-06-22 DIAGNOSIS — R197 Diarrhea, unspecified: Secondary | ICD-10-CM | POA: Diagnosis not present

## 2023-06-22 DIAGNOSIS — R1031 Right lower quadrant pain: Secondary | ICD-10-CM | POA: Insufficient documentation

## 2023-06-22 DIAGNOSIS — R109 Unspecified abdominal pain: Secondary | ICD-10-CM

## 2023-06-22 DIAGNOSIS — R112 Nausea with vomiting, unspecified: Secondary | ICD-10-CM | POA: Insufficient documentation

## 2023-06-22 LAB — URINALYSIS, ROUTINE W REFLEX MICROSCOPIC
Bilirubin Urine: NEGATIVE
Glucose, UA: NEGATIVE mg/dL
Hgb urine dipstick: NEGATIVE
Ketones, ur: NEGATIVE mg/dL
Leukocytes,Ua: NEGATIVE
Nitrite: NEGATIVE
Protein, ur: NEGATIVE mg/dL
Specific Gravity, Urine: 1.019 (ref 1.005–1.030)
pH: 5 (ref 5.0–8.0)

## 2023-06-22 LAB — COMPREHENSIVE METABOLIC PANEL WITH GFR
ALT: 16 U/L (ref 0–44)
AST: 20 U/L (ref 15–41)
Albumin: 4.2 g/dL (ref 3.5–5.0)
Alkaline Phosphatase: 75 U/L (ref 38–126)
Anion gap: 10 (ref 5–15)
BUN: 10 mg/dL (ref 6–20)
CO2: 27 mmol/L (ref 22–32)
Calcium: 9.7 mg/dL (ref 8.9–10.3)
Chloride: 102 mmol/L (ref 98–111)
Creatinine, Ser: 0.68 mg/dL (ref 0.44–1.00)
GFR, Estimated: >60 mL/min (ref >60)
Glucose, Bld: 107 mg/dL — ABNORMAL HIGH (ref 70–99)
Potassium: 3.3 mmol/L — ABNORMAL LOW (ref 3.5–5.1)
Sodium: 139 mmol/L (ref 135–145)
Total Bilirubin: 0.4 mg/dL (ref 0.0–1.2)
Total Protein: 7.8 g/dL (ref 6.5–8.1)

## 2023-06-22 LAB — CBC
HCT: 38.6 % (ref 36.0–46.0)
Hemoglobin: 13.1 g/dL (ref 12.0–15.0)
MCH: 30.1 pg (ref 26.0–34.0)
MCHC: 33.9 g/dL (ref 30.0–36.0)
MCV: 88.7 fL (ref 80.0–100.0)
Platelets: 431 10*3/uL — ABNORMAL HIGH (ref 150–400)
RBC: 4.35 MIL/uL (ref 3.87–5.11)
RDW: 12.4 % (ref 11.5–15.5)
WBC: 13.8 10*3/uL — ABNORMAL HIGH (ref 4.0–10.5)
nRBC: 0 % (ref 0.0–0.2)

## 2023-06-22 LAB — HCG, SERUM, QUALITATIVE: Preg, Serum: NEGATIVE

## 2023-06-22 LAB — LIPASE, BLOOD: Lipase: 34 U/L (ref 11–51)

## 2023-06-22 MED ORDER — ONDANSETRON 4 MG PO TBDP
8.0000 mg | ORAL_TABLET | Freq: Once | ORAL | Status: AC
  Administered 2023-06-22: 8 mg via ORAL
  Filled 2023-06-22: qty 2

## 2023-06-22 MED ORDER — OXYCODONE-ACETAMINOPHEN 5-325 MG PO TABS
1.0000 | ORAL_TABLET | Freq: Once | ORAL | Status: AC
  Administered 2023-06-22: 1 via ORAL
  Filled 2023-06-22: qty 1

## 2023-06-22 NOTE — ED Triage Notes (Signed)
 Abdominal pain for one week with n and v  some diarrhea  lmp April 9th

## 2023-06-23 ENCOUNTER — Emergency Department (HOSPITAL_COMMUNITY)

## 2023-06-23 MED ORDER — ONDANSETRON 4 MG PO TBDP: 4.0000 mg | ORAL_TABLET | Freq: Three times a day (TID) | ORAL | 0 refills | Status: AC | PRN

## 2023-06-23 MED ORDER — ONDANSETRON HCL 4 MG/2ML IJ SOLN
4.0000 mg | Freq: Once | INTRAMUSCULAR | Status: AC
  Administered 2023-06-23: 4 mg via INTRAVENOUS
  Filled 2023-06-23: qty 2

## 2023-06-23 MED ORDER — MORPHINE SULFATE (PF) 4 MG/ML IV SOLN
4.0000 mg | Freq: Once | INTRAVENOUS | Status: AC
  Administered 2023-06-23: 4 mg via INTRAVENOUS
  Filled 2023-06-23: qty 1

## 2023-06-23 MED ORDER — IOHEXOL 350 MG/ML SOLN
75.0000 mL | Freq: Once | INTRAVENOUS | Status: AC | PRN
  Administered 2023-06-23: 75 mL via INTRAVENOUS

## 2023-06-23 NOTE — Discharge Instructions (Signed)
 Your workup this morning was reassuring.  Please take the prescribed medication for nausea as needed.  If you have worsening of symptoms or develop other life-threatening symptoms please return to the emergency department.

## 2023-06-23 NOTE — ED Provider Notes (Signed)
 [place] EMERGENCY DEPARTMENT AT [facility] Provider Note   CSN: [id number] Arrival date & time: 06/22/23  2217     History  Chief Complaint  Patient presents with   Abdominal Pain    Paige Bruce is a 43 y.o. female.Patient presents to the emergency department complaining of RLQ abdominal pain with associated nausea and vomiting. She states that the pain began at 8pm. She endorses mild diarrhea but states this is normal for her. She describes the pain as being sharp. She denies chest pain, shortness of breath, known fever, vaginal discharge, dysuria. Past medical history significant for cholecystectomy, tubal ligation.    Abdominal Pain      Home Medications Prior to Admission medications   Medication Sig Start Date End Date Taking? Authorizing Provider  ondansetron  (ZOFRAN -ODT) 4 MG disintegrating tablet Take 1 tablet (4 mg total) by mouth every 8 (eight) hours as needed for nausea or vomiting. 06/23/23  Yes Elisa Guest, PA-C  acetaminophen (TYLENOL) 500 MG tablet Take 1,000 mg by mouth every 6 (six) hours as needed for mild pain, moderate pain or headache.    [provider]      Allergies    Patient has no known allergies.    Review of Systems   Review of Systems  Gastrointestinal:  Positive for abdominal pain.    Physical Exam Updated Vital Signs BP 101/67   Pulse 77   Temp 97.6 F (36.4 C) (Oral)   Resp 17   Ht 4\' 11"  (1.499 m)   Wt 67.2 kg   LMP 05/23/2023   SpO2 99%   BMI 29.92 kg/m  Physical Exam Vitals and nursing note reviewed.  Constitutional:      General: She is not in acute distress.    Appearance: She is well-developed.  HENT:     Head: Normocephalic and atraumatic.  Eyes:     Conjunctiva/sclera: Conjunctivae normal.  Cardiovascular:     Rate and Rhythm: Normal rate and regular rhythm.     Heart sounds: No murmur heard. Pulmonary:     Effort: Pulmonary effort is normal. No respiratory distress.     Breath  sounds: Normal breath sounds.  Abdominal:     Palpations: Abdomen is soft.     Tenderness: There is abdominal tenderness in the right lower quadrant.  Musculoskeletal:        General: No swelling.     Cervical back: Neck supple.  Skin:    General: Skin is warm and dry.     Capillary Refill: Capillary refill takes less than 2 seconds.  Neurological:     Mental Status: She is alert.  Psychiatric:        Mood and Affect: Mood normal.     ED Results / Procedures / Treatments   Labs (all labs ordered are listed, but only abnormal results are displayed) Labs Reviewed  COMPREHENSIVE METABOLIC PANEL WITH GFR - Abnormal; Notable for the following components:      Result Value   Potassium 3.3 (*)    Glucose, Bld 107 (*)    All other components within normal limits  CBC - Abnormal; Notable for the following components:   WBC 13.8 (*)    Platelets 431 (*)    All other components within normal limits  URINALYSIS, ROUTINE W REFLEX MICROSCOPIC - Abnormal; Notable for the following components:   APPearance HAZY (*)    All other components within normal limits  LIPASE, BLOOD  HCG, SERUM, QUALITATIVE  EKG None  Radiology CT ABDOMEN PELVIS W CONTRAST Result Date: 06/23/2023 CLINICAL DATA:  Right lower quadrant pain EXAM: CT ABDOMEN AND PELVIS WITH CONTRAST TECHNIQUE: Multidetector CT imaging of the abdomen and pelvis was performed using the standard protocol following bolus administration of intravenous contrast. RADIATION DOSE REDUCTION: This exam was performed according to the departmental dose-optimization program which includes automated exposure control, adjustment of the mA and/or kV according to patient size and/or use of iterative reconstruction technique. CONTRAST:  75mL OMNIPAQUE IOHEXOL 350 MG/ML SOLN COMPARISON:  02/26/2018 FINDINGS: Lower chest:  No contributory findings. Hepatobiliary: Subtle but convincing mass at the caudate measuring 3.4 cm, not clearly seen in 2020 but  known from 2016 and 2018 imaging reports. No signs of underlying cirrhosis.Cholecystectomy without worrisome biliary dilatation. Pancreas: Unremarkable. Spleen: Unremarkable. Adrenals/Urinary Tract: Negative adrenals. No hydronephrosis or stone. Unremarkable bladder. Stomach/Bowel:  No obstruction. No appendicitis. Vascular/Lymphatic: No acute vascular abnormality. No mass or adenopathy. Reproductive:No pathologic findings. Other: No ascites or pneumoperitoneum. Musculoskeletal: No acute abnormalities. IMPRESSION: No acute finding.  No appendicitis. 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 Electronically Signed   By: Ronnette Coke M.D.   On: 06/23/2023 06:01    Procedures Procedures    Medications Ordered in ED Medications  ondansetron  (ZOFRAN -ODT) disintegrating tablet 8 mg (8 mg Oral Given 06/22/23 2241)  oxyCODONE-acetaminophen (PERCOCET/ROXICET) 5-325 MG per tablet 1 tablet (1 tablet Oral Given 06/22/23 2242)  ondansetron  (ZOFRAN ) injection 4 mg (4 mg Intravenous Given 06/23/23 0156)  morphine (PF) 4 MG/ML injection 4 mg (4 mg Intravenous Given 06/23/23 0157)  iohexol (OMNIPAQUE) 350 MG/ML injection 75 mL (75 mLs Intravenous Contrast Given 06/23/23 0331)    ED Course/ Medical Decision Making/ A&P                                 Medical Decision Making Amount and/or Complexity of Data Reviewed Radiology: ordered.  Risk Prescription drug management.   This patient presents to the ED for concern of abdominal pain, this involves an extensive number of treatment options, and is a complaint that carries with it a high risk of complications and morbidity.  The differential diagnosis includes appendicitis, colitis, gastroenteritis, others   Co morbidities that complicate the patient evaluation  History of cholecystectomy, tubal ligation   Additional history obtained:   External records from outside source obtained and reviewed including family medicine  notes   Lab Tests:  I Ordered, and personally interpreted labs.  The pertinent results include: Leukocytosis with a white count of 13,800   Imaging Studies ordered:  I ordered imaging studies including CT abdomen pelvis with contrast I independently visualized and interpreted imaging which showed  No acute finding.  No appendicitis.    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  No acute findings on CT scan to explain  patient's symptoms.  Laboratory work with no gross abnormalities.  At this time patient appears stable for discharge with no obvious acute surgical or emergent cause of her symptoms.  Plan to discharge home with prescription for Zofran .  Return precautions provided.         Final Clinical Impression(s) / ED Diagnoses Final diagnoses:  Abdominal pain, unspecified abdominal location  Nausea and vomiting, unspecified vomiting type    Rx / DC Orders ED Discharge Orders          Ordered    ondansetron  (ZOFRAN -ODT) 4 MG disintegrating tablet  Every 8 hours PRN        06/23/23 0619              Elisa Guest, PA-C 06/23/23 2841    Alissa April, MD 06/23/23 [phone]

## 2023-06-23 NOTE — ED Notes (Signed)
 [facility] radiology to have the CT escalated and assigned to a radiologist

## 2023-07-05 NOTE — Progress Notes (Unsigned)
 [facility] Initial Consultation   Referring Provider Medicine, [facility] [address] Paige Bruce [place],  [place] [address]  Primary Care Provider Medicine, [facility]  Patient Profile: Paige Bruce is a 43 y.o. female  who is seen in consultation in the [facility] at the request of Dr. Medicine for evaluation and management of the problem(s) noted below.  Problem List: Abdominal pain History of H. pylori infection and peptic ulcer disease Status post cholecystectomy Family history of colorectal cancer   History of Present Illness   Paige Bruce is a 43 y.o. female with a history of with a past medical history noteworthy for HTN and migraines.   Last colonoscopy: *** Last endoscopy: ***  Last Abd CT/CTE/MRE: 06/2023 -no acute findings, stable 3.4 cm mass in the caudate lobe of the liver stable compared to CT 2016, status postcholecystectomy  GI Review of Symptoms Significant for {[id number]}. Otherwise negative.  General Review of Systems  Review of systems is significant for the pertinent positives and negatives as listed per the HPI.  Full ROS is otherwise negative.  Past Medical History   No past medical history on file.   Past Surgical History   Past Surgical History:  Procedure Laterality Date   CHOLECYSTECTOMY     TUBAL LIGATION       Allergies and Medications   No Known Allergies  @MEDSTODAY @  Family History   Family History  Problem Relation Age of Onset   Diabetes Father    Hypertension Father    Cancer Father      Social History   Social History   Tobacco Use   Smoking status: Never   Smokeless tobacco: Never  Vaping Use   Vaping status: Never Used  Substance Use Topics   Alcohol use: No   Drug use: No   Paige Bruce reports that she has never smoked. She has never used smokeless tobacco. She reports that she does not drink alcohol and does not use  drugs.  Vital Signs and Physical Examination   There were no vitals filed for this visit. There is no height or weight on file to calculate BMI.    General: Well developed, well nourished, no acute distress Head: Normocephalic and atraumatic Eyes: Sclerae anicteric, EOMI Ears: Normal auditory acuity Mouth: No deformities or lesions noted Lungs: Clear throughout to auscultation Heart: Regular rate and rhythm; No murmurs, rubs or bruits Abdomen: Soft, non tender and non distended. No masses, hepatosplenomegaly or hernias noted. Normal Bowel sounds Rectal: Musculoskeletal: Symmetrical with no gross deformities  Pulses:  Normal pulses noted Extremities: No edema or deformities noted Neurological: Alert oriented x 4, grossly nonfocal Psychological:  Alert and cooperative. Normal mood and affect  Review of Data  The following data was reviewed at the time of this encounter:  Laboratory Studies      Latest Ref Rng & Units 06/22/2023   10:41 PM 05/21/2020    7:42 PM 06/26/2019    8:18 PM  CBC  WBC 4.0 - 10.5 K/uL 13.8  12.9  19.8   Hemoglobin 12.0 - 15.0 g/dL 78.2  95.6  21.3   Hematocrit 36.0 - 46.0 % 38.6  38.5  46.1   Platelets 150 - 400 K/uL 431  356  435     Lab Results  Component Value Date   LIPASE 34 06/22/2023      Latest Ref Rng & Units 06/22/2023   10:41 PM 05/21/2020    7:42 PM 06/26/2019  8:18 PM  CMP  Glucose 70 - 99 mg/dL 604  540  96   BUN 6 - 20 mg/dL 10  10  18    Creatinine 0.44 - 1.00 mg/dL 9.81  1.91  4.78   Sodium 135 - 145 mmol/L 139  137  135   Potassium 3.5 - 5.1 mmol/L 3.3  3.3  4.6   Chloride 98 - 111 mmol/L 102  102  107   CO2 22 - 32 mmol/L 27  24  19    Calcium 8.9 - 10.3 mg/dL 9.7  9.0  8.9   Total Protein 6.5 - 8.1 g/dL 7.8  8.0  8.1   Total Bilirubin 0.0 - 1.2 mg/dL 0.4  0.5  0.6   Alkaline Phos 38 - 126 U/L 75  111  94   AST 15 - 41 U/L 20  34  25   ALT 0 - 44 U/L 16  37  31      Imaging Studies  CTAP 06/23/2023 No acute finding.  No  appendicitis.  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  GI Procedures and Studies      Clinical Impression  It is my clinical impression that Paige Bruce is a 43 y.o. female with;  ***  Plan  *** *** *** *** ***  Planned Follow Up No follow-ups on file.  The patient or caregiver verbalized understanding of the material covered, with no barriers to understanding. All questions were answered. Patient or caregiver is agreeable with the plan outlined above.    It was a pleasure to see Paige Bruce.  If you have any questions or concerns regarding this evaluation, do not hesitate to contact me.  Eugenia Hess, MD [facility]

## 2023-07-06 ENCOUNTER — Ambulatory Visit: Admitting: Pediatrics

## 2023-07-06 ENCOUNTER — Encounter: Payer: Self-pay | Admitting: Pediatrics

## 2023-07-06 ENCOUNTER — Other Ambulatory Visit (INDEPENDENT_AMBULATORY_CARE_PROVIDER_SITE_OTHER)

## 2023-07-06 VITALS — BP 118/62 | HR 88 | Ht <= 58 in | Wt 139.0 lb

## 2023-07-06 DIAGNOSIS — R109 Unspecified abdominal pain: Secondary | ICD-10-CM

## 2023-07-06 DIAGNOSIS — R0789 Other chest pain: Secondary | ICD-10-CM | POA: Diagnosis not present

## 2023-07-06 DIAGNOSIS — Z800 Family history of malignant neoplasm of digestive organs: Secondary | ICD-10-CM

## 2023-07-06 DIAGNOSIS — K5900 Constipation, unspecified: Secondary | ICD-10-CM

## 2023-07-06 DIAGNOSIS — Z9049 Acquired absence of other specified parts of digestive tract: Secondary | ICD-10-CM

## 2023-07-06 DIAGNOSIS — R932 Abnormal findings on diagnostic imaging of liver and biliary tract: Secondary | ICD-10-CM | POA: Diagnosis not present

## 2023-07-06 DIAGNOSIS — R110 Nausea: Secondary | ICD-10-CM

## 2023-07-06 DIAGNOSIS — R1011 Right upper quadrant pain: Secondary | ICD-10-CM | POA: Diagnosis not present

## 2023-07-06 LAB — C-REACTIVE PROTEIN: CRP: <1 mg/dL (ref 0.5–20.0)

## 2023-07-06 LAB — LIPASE: Lipase: 44 U/L (ref 11.0–59.0)

## 2023-07-06 LAB — SEDIMENTATION RATE: Sed Rate: 12 mm/h (ref 0–20)

## 2023-07-06 MED ORDER — LINACLOTIDE 145 MCG PO CAPS: 145.0000 ug | ORAL_CAPSULE | Freq: Every day | ORAL | 3 refills | Status: AC

## 2023-07-06 MED ORDER — OMEPRAZOLE 40 MG PO CPDR: 40.0000 mg | DELAYED_RELEASE_CAPSULE | Freq: Every day | ORAL | 3 refills | Status: AC

## 2023-07-06 NOTE — Patient Instructions (Signed)
 Your provider has requested that you go to the basement level for lab work before leaving today. Press "B" on the elevator. The lab is located at the first door on the left as you exit the elevator.   Due to recent changes in healthcare laws, you may see the results of your imaging and laboratory studies on MyChart before your provider has had a chance to review them.  We understand that in some cases there may be results that are confusing or concerning to you. Not all laboratory results come back in the same time frame and the provider may be waiting for multiple results in order to interpret others.  Please give us  48 hours in order for your provider to thoroughly review all the results before contacting the office for clarification of your results.    We have sent the following medications to your pharmacy for you to pick up at your convenience: Linzess 145 mcg, take 1 capsule daily before breakfast.  Omeprazole 40 mg, take 1 capsule daily 20-30 minutes before a meal.  Follow up in 3 months.  You have been scheduled for an endoscopy. Please follow written instructions given to you at your visit today.  If you use inhalers (even only as needed), please bring them with you on the day of your procedure.  If you take any of the following medications, they will need to be adjusted prior to your procedure:   DO NOT TAKE 7 DAYS PRIOR TO TEST- Trulicity (dulaglutide) Ozempic, Wegovy (semaglutide) Mounjaro (tirzepatide) Bydureon Bcise (exanatide extended release)  DO NOT TAKE 1 DAY PRIOR TO YOUR TEST Rybelsus (semaglutide) Adlyxin (lixisenatide) Victoza (liraglutide) Byetta (exanatide) ___________________________________________________________________________  Thank you for entrusting me with your care and for choosing [facility],  Dr. Eugenia Hess  _______________________________________________________  If your blood pressure at your visit was 140/90 or greater, please  contact your primary care physician to follow up on this.  _______________________________________________________  If you are age 41 or older, your body mass index should be between 23-30. Your Body mass index is 29.05 kg/m. If this is out of the aforementioned range listed, please consider follow up with your Primary Care Provider.  If you are age 40 or younger, your body mass index should be between 19-25. Your Body mass index is 29.05 kg/m. If this is out of the aformentioned range listed, please consider follow up with your Primary Care Provider.   ________________________________________________________  The [facility] providers would like to encourage you to use MYCHART to communicate with providers for non-urgent requests or questions.  Due to long hold times on the telephone, sending your provider a message by [facility] may be a faster and more efficient way to get a response.  Please allow 48 business hours for a response.  Please remember that this is for non-urgent requests.  _______________________________________________________

## 2023-07-07 LAB — IGA: Immunoglobulin A: 258 mg/dL (ref 47–310)

## 2023-07-07 LAB — TISSUE TRANSGLUTAMINASE ABS,IGG,IGA
(tTG) Ab, IgA: <1 U/mL
(tTG) Ab, IgG: <1 U/mL

## 2023-07-08 ENCOUNTER — Encounter: Payer: Self-pay | Admitting: Pediatrics

## 2023-08-12 NOTE — Progress Notes (Unsigned)
 [facility] History and Physical   Primary Care Physician:  Mavis Redge SAILOR, FNP   Reason for Procedure:  RUQ abdominal pain, chest pain, family history of gastric cancer, possible previous history of peptic ulcer disease  Plan:    EGD     HPI: Paige Bruce is a 43 y.o. female undergoing EGD for investigation of RUQ abdominal pain, chest pain and family history of gastric cancer.  She reports the recent onset of RUQ abdominal pain when laying down to go to bed at night.  Pain was described as being little needles but also sharp and twisting at times.  Pain exacerbated by the ingestion of greasy and fried foods.  Denies GERD, regurgitation, dysphagia or odynophagia.  Her chart reports a history of peptic ulcer disease with H. pylori.  She denies any known history of this.  States that her father had gastric cancer at the age of 22.   Past Medical History:  Diagnosis Date   HTN (hypertension)    Migraine     Past Surgical History:  Procedure Laterality Date   CHOLECYSTECTOMY     TUBAL LIGATION      Prior to Admission medications   Medication Sig Start Date End Date Taking? Authorizing Provider  acetaminophen  (TYLENOL ) 500 MG tablet Take 1,000 mg by mouth every 6 (six) hours as needed for mild pain, moderate pain or headache.    [provider]  linaclotide  (LINZESS ) 145 MCG CAPS capsule Take 1 capsule (145 mcg total) by mouth daily before breakfast. 07/06/23   Akari Defelice, Inocente HERO, MD  omeprazole  (PRILOSEC) 40 MG capsule Take 1 capsule (40 mg total) by mouth daily. Take 20-30 minutes before a meal. 07/06/23   Nianna Igo, Inocente HERO, MD  ondansetron  (ZOFRAN -ODT) 4 MG disintegrating tablet Take 1 tablet (4 mg total) by mouth every 8 (eight) hours as needed for nausea or vomiting. 06/23/23   Logan Ubaldo NOVAK, PA-C    Current Outpatient Medications  Medication Sig Dispense Refill   acetaminophen  (TYLENOL ) 500 MG tablet Take 1,000 mg by mouth every 6 (six) hours as  needed for mild pain, moderate pain or headache.     linaclotide  (LINZESS ) 145 MCG CAPS capsule Take 1 capsule (145 mcg total) by mouth daily before breakfast. 30 capsule 3   omeprazole  (PRILOSEC) 40 MG capsule Take 1 capsule (40 mg total) by mouth daily. Take 20-30 minutes before a meal. 90 capsule 3   ondansetron  (ZOFRAN -ODT) 4 MG disintegrating tablet Take 1 tablet (4 mg total) by mouth every 8 (eight) hours as needed for nausea or vomiting. 20 tablet 0   No current facility-administered medications for this visit.    Allergies as of 08/13/2023   (No Known Allergies)    Family History  Problem Relation Age of Onset   Diabetes Father    Hypertension Father    Cancer Father        Gastric cancer age 16   Liver disease Neg Hx    Colon cancer Neg Hx    Esophageal cancer Neg Hx     Social History   Socioeconomic History   Marital status: Married    Spouse name: Not on file   Number of children: 4   Years of education: Not on file   Highest education level: Not on file  Occupational History   Not on file  Tobacco Use   Smoking status: Never   Smokeless tobacco: Never  Vaping Use   Vaping status: Never Used  Substance and Sexual  Activity   Alcohol use: No   Drug use: No   Sexual activity: Not on file  Other Topics Concern   Not on file  Social History Narrative   Not on file   Social Drivers of Health   Financial Resource Strain: Low Risk  (06/28/2023)   Received from [facility]   Overall Financial Resource Strain (CARDIA)    Difficulty of Paying Living Expenses: Not hard at all  Food Insecurity: No Food Insecurity (06/28/2023)   Received from [facility]   Hunger Vital Sign    Within the past 12 months, you worried that your food would run out before you got the money to buy more.: Never true    Within the past 12 months, the food you bought just didn't last and you didn't have money to get more.: Never true  Transportation Needs: No Transportation Needs  (06/28/2023)   Received from [facility] - Transportation    Lack of Transportation (Medical): No    Lack of Transportation (Non-Medical): No  Physical Activity: Unknown (06/28/2023)   Received from [facility]   Exercise Vital Sign    On average, how many days per week do you engage in moderate to strenuous exercise (like a brisk walk)?: 0 days    Minutes of Exercise per Session: Not on file  Stress: No Stress Concern Present (06/28/2023)   Received from [facility] - Occupational Stress Questionnaire    Feeling of Stress : Not at all  Social Connections: Socially Integrated (06/28/2023)   Received from [facility]   Social Network    How would you rate your social network (family, work, friends)?: Good participation with social networks  Intimate Partner Violence: Not At Risk (06/28/2023)   Received from [facility]   HITS    Over the last 12 months how often did your partner physically hurt you?: Never    Over the last 12 months how often did your partner insult you or talk down to you?: Never    Over the last 12 months how often did your partner threaten you with physical harm?: Never    Over the last 12 months how often did your partner scream or curse at you?: Never    Review of Systems:  All other review of systems negative except as mentioned in the HPI.  Physical Exam: Vital signs There were no vitals taken for this visit.  General:   Alert,  Well-developed, well-nourished, pleasant and cooperative in NAD Airway:  Mallampati  Lungs:  Clear throughout to auscultation.   Heart:  Regular rate and rhythm; no murmurs, clicks, rubs,  or gallops. Abdomen:  Soft, nontender and nondistended. Normal bowel sounds.   Neuro/Psych:  Normal mood and affect. A and O x 3  Inocente Hausen, MD [facility]

## 2023-08-13 ENCOUNTER — Encounter: Payer: Self-pay | Admitting: Pediatrics

## 2023-08-13 ENCOUNTER — Ambulatory Visit: Admitting: Pediatrics

## 2023-08-13 VITALS — BP 107/71 | HR 79 | Temp 97.3°F | Resp 14 | Ht <= 58 in | Wt 139.0 lb

## 2023-08-13 DIAGNOSIS — K2970 Gastritis, unspecified, without bleeding: Secondary | ICD-10-CM

## 2023-08-13 DIAGNOSIS — R1011 Right upper quadrant pain: Secondary | ICD-10-CM

## 2023-08-13 DIAGNOSIS — K2950 Unspecified chronic gastritis without bleeding: Secondary | ICD-10-CM

## 2023-08-13 DIAGNOSIS — B9681 Helicobacter pylori [H. pylori] as the cause of diseases classified elsewhere: Secondary | ICD-10-CM

## 2023-08-13 DIAGNOSIS — Z800 Family history of malignant neoplasm of digestive organs: Secondary | ICD-10-CM | POA: Diagnosis not present

## 2023-08-13 DIAGNOSIS — R079 Chest pain, unspecified: Secondary | ICD-10-CM

## 2023-08-13 DIAGNOSIS — R109 Unspecified abdominal pain: Secondary | ICD-10-CM

## 2023-08-13 MED ORDER — SODIUM CHLORIDE 0.9 % IV SOLN: 500.0000 mL | INTRAVENOUS | Status: DC

## 2023-08-13 NOTE — Progress Notes (Signed)
 Sedate, gd SR, tolerated procedure well, VSS, report to RN

## 2023-08-13 NOTE — Op Note (Signed)
 [facility] Patient Name: Paige Bruce Procedure Date: 08/13/2023 3:37 PM MRN: [id number] Endoscopist: Inocente Hausen , MD, [id number] Age: 43 Referring MD:  Date of Birth: Dec 12, 1980 Gender: Female Account #: [id number] Procedure:                Upper GI endoscopy Indications:              Abdominal pain in the right upper quadrant,                            Unexplained chest pain, Family history of gastric                            cancer, Possible personal history of peptic ulcer                            disease Medicines:                Monitored Anesthesia Care Procedure:                Pre-Anesthesia Assessment:                           - Prior to the procedure, a History and Physical                            was performed, and patient medications and                            allergies were reviewed. The patient's tolerance of                            previous anesthesia was also reviewed. The risks                            and benefits of the procedure and the sedation                            options and risks were discussed with the patient.                            All questions were answered, and informed consent                            was obtained. Prior Anticoagulants: The patient has                            taken no anticoagulant or antiplatelet agents. ASA                            Grade Assessment: II - A patient with mild systemic                            disease. After reviewing the risks and benefits,  the patient was deemed in satisfactory condition to                            undergo the procedure.                           After obtaining informed consent, the endoscope was                            passed under direct vision. Throughout the                            procedure, the patient's blood pressure, pulse, and                            oxygen saturations were monitored continuously. The                             Endoscope was introduced through the mouth, and                            advanced to the second part of duodenum. The upper                            GI endoscopy was accomplished without difficulty.                            The patient tolerated the procedure well. Scope In: Scope Out: Findings:                 The examined esophagus was normal.                           Diffuse moderate inflammation characterized by                            erythema was found in the gastric body, in the                            gastric antrum, in the cardia (on retroflexion) and                            in the gastric fundus (on retroflexion). Biopsies                            were taken with a cold forceps for histology.                            Biopsies were taken with a cold forceps for                            Helicobacter pylori testing.                           The duodenal bulb and  second portion of the                            duodenum were normal. Complications:            No immediate complications. Estimated blood loss:                            Minimal. Estimated Blood Loss:     Estimated blood loss was minimal. Impression:               - Normal esophagus.                           - Gastritis. Biopsied. Evaluate for H. pylori.                           - Normal duodenal bulb and second portion of the                            duodenum. Recommendation:           - Discharge patient to home (ambulatory).                           - Await pathology results.                           - The findings and recommendations were discussed                            with the patient's family.                           - Return to GI clinic in 2 months with Dr. Suzann                            or APP.                           - Patient has a contact number available for                            emergencies. The signs and symptoms of potential                             delayed complications were discussed with the                            patient. Return to normal activities tomorrow.                            Written discharge instructions were provided to the                            patient. Inocente Suzann, MD 08/13/2023 4:05:11 PM This report has been signed electronically.

## 2023-08-13 NOTE — Patient Instructions (Addendum)
 Se proporcion material educativo al paciente sobre gastritis.  Reanudar la dieta anterior.  Continuar con la medicacin actual.  Esperando los [place] de patologa.  USTED TUVO UN PROCEDIMIENTO ENDOSCPICO HOY EN EL [facility]:   Lea el informe del procedimiento que se le entreg para cualquier pregunta especfica sobre lo que se Dentist.  Si el informe del examen no responde a sus preguntas, por favor llame a su gastroenterlogo para aclararlo.  Si usted solicit que no se le den [employer] de lo que se Clinical cytogeneticist en su procedimiento al [employer] va a cuidar, entonces el informe del procedimiento se ha incluido en un sobre sellado para que usted lo revise despus cuando le sea ms conveniente.   LO QUE PUEDE ESPERAR: Algunas sensaciones de hinchazn en el abdomen.  Puede tener ms gases de lo normal.  El caminar puede ayudarle a eliminar el aire que se le puso en el tracto gastrointestinal durante el procedimiento y reducir la hinchazn.  Si le hicieron una endoscopia inferior (como una colonoscopia o una sigmoidoscopia flexible), podra notar manchas de sangre en las heces fecales o en el papel higinico.  Si se someti a una preparacin intestinal para su procedimiento, es posible que no tenga una evacuacin intestinal normal durante [employer].   Tenga en cuenta:  Es posible que note un poco de irritacin y congestin en la nariz o algn drenaje.  Esto es debido al oxgeno [employer] durante su procedimiento.  No hay que preocuparse y esto debe desaparecer ms o Regulatory affairs officer.   SNTOMAS PARA REPORTAR INMEDIATAMENTE:   Despus de la endoscopia superior (EGD)  Vmitos de Retail buyer o material como caf molido   Dolor en el pecho o dolor debajo de los omplatos que antes no tena   Dolor o dificultad persistente para tragar  Falta de aire que antes no tena   Fiebre de 100F o ms  Heces fecales negras y pegajosas   Para asuntos urgentes o de  Associate Professor, puede comunicarse con un gastroenterlogo a cualquier hora llamando al [phone].  DIETA:  Recomendamos una comida pequea al principio, pero luego puede continuar con su dieta normal.  Tome muchos lquidos, pero debe evitar las bebidas alcohlicas durante 24 horas.    ACTIVIDAD:  Debe planear tomarse las cosas con calma por el resto del da y no debe CONDUCIR ni usar maquinaria pesada Patent examiner (debido a los medicamentos de sedacin utilizados durante el examen).     SEGUIMIENTO: Nuestro personal llamar al nmero que aparece en su historial al siguiente da hbil de su procedimiento para ver cmo se siente y para responder cualquier pregunta o inquietud que pueda tener con respecto a la informacin que se le dio despus del procedimiento. Si no podemos contactarle, le dejaremos un mensaje.  Sin embargo, si se siente bien y no tiene English as a second language teacher, no es necesario que nos devuelva la llamada.  Asumiremos que ha regresado a sus actividades diarias normales sin incidentes. Si se le tomaron algunas biopsias, le contactaremos por telfono o por carta en las prximas 3 semanas.  Si no ha sabido [employer] biopsias en el transcurso de 3 semanas, por favor llmenos al [phone].   FIRMAS/CONFIDENCIALIDAD: Usted y/o el acompaante que le cuide han firmado documentos que se ingresarn en su historial mdico electrnico.  Estas firmas atestiguan el hecho de que la informacin anterior      Gastritis en adultos Gastritis, Adult La gastritis es irritacin  e hinchazn (inflamacin) del estmago. Hay dos tipos de gastritis: Gastritis aguda. Este tipo se desarrolla rpidamente. Gastritis crnica. Este tipo es mucho ms frecuente. Se desarrolla lentamente y dura un largo tiempo. Es importante recibir ayuda para Copy. Si no obtiene [place], Radiographer, therapeutic y Water engineer (lceras) en el [employer]. Cules son las causas? Esta afeccin puede ser causada  por lo siguiente: Grmenes que llegan al estmago y provocan una infeccin. Beber alcohol en exceso. Los medicamentos que toma. Tener demasiado cido en el estmago. Tener una enfermedad del estmago. Algunas otras causas son las siguientes: Runner, broadcasting/film/video. Algunos tratamientos para el cncer (radiacin). Fumar cigarrillos o usar productos que contienen nicotina o tabaco. En algunos casos, se desconoce la causa de esta afeccin. Qu incrementa el riesgo? Tener una enfermedad de los intestinos. Tener enfermedad de Crohn. Usar aspirina o ibuprofeno y otros antiinflamatorios no esteroideos (AINE) para tratar otras afecciones. Estrs. Cules son los signos o sntomas? Dolor en Investment banker, corporate. Una sensacin de Surveyor, minerals. Sensacin de que va a vomitar (nuseas). Vmitos o vmitos con sangre. Sensacin de estar demasiado lleno luego de comer. Prdida de peso. Mal aliento. Sangre en las heces (deposiciones). En algunos casos, no hay sntomas. Cmo se trata? Esta afeccin se trata con medicamentos. Los medicamentos que se usan dependen de lo que provoc la afeccin. Es posible que le administren lo siguiente: Antibiticos, si la causa de la afeccin fue una infeccin provocada por grmenes. Bloqueadores H2 y medicamentos similares, si la afeccin fue causada por una gran cantidad de cido en el estmago. El tratamiento tambin puede incluir interrumpir el uso de ciertos medicamentos, como aspirina o ibuprofeno. Siga estas instrucciones en su casa: Medicamentos Use los medicamentos de venta libre y los recetados solamente como se lo haya indicado el mdico. Si le recetaron un antibitico, tmelo como se lo haya indicado el mdico. No deje de tomarlo aunque comience a sentirse mejor. Consumo de alcohol No beba alcohol si: El mdico le indica que no lo haga. Est embarazada, puede estar embarazada o est tratando de quedar embarazada. Si bebe alcohol: Limite su uso a las  siguientes medidas: De 0 a 1 medida por da para las mujeres. De 0 a 2 medidas por da para los hombres. Sepa cunta cantidad de alcohol hay en las bebidas que toma. En los [address], una medida equivale a una botella de cerveza de 12 oz (355 ml), un vaso de vino de 5 oz (148 ml) o un vaso de una bebida alcohlica de alta graduacin de 1 oz (44 ml). Instrucciones generales  Haga comidas pequeas y frecuentes en lugar de comidas abundantes. Evite los alimentos y las bebidas que lo Dietitian. Beba suficiente lquido para Radio producer pis (la orina) de color amarillo plido. Converse con el mdico sobre maneras de Charity fundraiser. Puede realizar ejercicios de respiracin profunda, meditacin o yoga. No fume ni consuma ningn producto que contenga nicotina o tabaco. Si necesita ayuda para dejar de fumar, consulte al mdico. Concurra a todas las visitas de seguimiento. Comunquese con un mdico si: Sus sntomas empeoran. El dolor de [place]. Los sntomas desaparecen y luego reaparecen. Tiene fiebre. Solicite ayuda de inmediato si: Vomita sangre o una sustancia parecida a los granos de [place]. La materia fecal es negra o de color rojo oscuro. Vomita cada vez que intenta tomar lquidos. Estos sntomas pueden Customer service manager. Solicite ayuda de inmediato. Comunquese con el servicio de emergencias de su  localidad (911 en los [place]). No espere a ver si los sntomas desaparecen. No conduzca por sus propios medios [employer] [facility]. Resumen La gastritis es irritacin e hinchazn (inflamacin) del estmago. Debe obtener ayuda para tratar esta afeccin. Si no obtiene [place], Radiographer, therapeutic y Water engineer (lceras) en el [employer]. Puede recibir tratamiento con medicamentos para los grmenes o medicamentos para bloquear la presencia de demasiada cantidad de cido Higher education careers adviser. Esta informacin no tiene Theme park manager el consejo del  mdico. Asegrese de hacerle al mdico cualquier pregunta que tenga. Document Revised: 07/01/2020 Document Reviewed: 07/01/2020 Elsevier Patient Education  2024 [employer].

## 2023-08-14 ENCOUNTER — Telehealth: Payer: Self-pay | Admitting: Lactation Services

## 2023-08-14 NOTE — Progress Notes (Addendum)
 Patient: Paige Bruce, Pyeatt DOB: [birth year] CHART#: [id number] DATE OF SERVICE: August 14, 2023  Assessment and Plan  1. Insomnia, unspecified type (Primary) -     traZODone (DESYREL) 50 mg tablet; Take one tablet (50 mg dose) by mouth at bedtime., Starting Tue 08/14/2023, Normal 2. Hypertension, unspecified type -     Blood Pressure KIT; 1 each by Does not apply route daily., Starting Tue 08/14/2023, Normal 3. Pruritic rash -     hydrocortisone 2.5 % cream; Apply to affected area 2 times daily, Normal 4. Acute nonintractable headache, unspecified headache type -     CBC And Differential; Future -     Comprehensive Metabolic Panel; Future -     Vitamin D 25 Hydroxy; Future -     Vitamin B12; Future; Expected date: 08/15/2023 -     TSH Reflex T4Free/T3 Hormone; Future    Assessment & Plan Insomnia, anxiousness. - Symptoms include waking up at night and worrying about daily tasks, suggestive of anxious etiology, PHQ and GAD unable to be completed today because the patient had an emergency call that required her to leave before completing the questionaires.  -Discussed the cyclical cycle of insufficent sleep and impact of mood. Patient is open to trying medication to help falling and staying asleep. Advised patient to adhere to good sleep hygiene, as advised on AVS, and spoke with patient at length. Advised patient to speak with family about difficulties with sleeping and instructing them to not interrupt her sleep routine unless necessary.  - Will begin trazodone, side effects discussed. Patient will contact via Mychart on her tolerance of trazodone in one week. To consider increasing dose at that time. Advised patient to take this medication 12 hours before needing to wake up the next day, approximately an hour or two before going to bed.  -Suggested patient consider speaking with therapy about current struggles, patient defers at this time. Resources provided on AVS.   Headaches,  hypertension - Reports pain under the eye and in the neck. Sometimes relieved with Tylenol  and soda. Headaches increasing in frequency over the past few months. -Several potential etiologies including increased stress, electrolyte deficiencies, decreased caffeine intake, low water intake, blood pressure. Headaches may be linked to caffeine intake and inadequate hydration. - Advised to gradually transition from juice to water by diluting juice with water over several weeks and to add magnesium supplements to reduce headache frequency. - Currently taking propranolol for hypertension. Recommended monitoring blood pressure at home, especially during headaches, to ensure propranolol dosage is appropriate. Blood pressure within goal today in-clinic.  -Will prescribe blood pressure cuff and advised patient to monitor blood pressures at home, especially when she begins to exhibit headaches. -Discussed adhering to low-salt diet and avoiding pre-processed foods. Advised regular exercise.  -Patient unable to obtain labs today due to needing to attend to her boyfriend, who was in a car crash during our visit today. Will pend orders for patient to obtain labs at a future date:  Will obtain: -CBC to assess for anemia or infection -TSH and Free T4 to assess thyroid function -B12 to assess deficiency -Vitamin D to assess deficiency  Skin rash. - Presents with dry, itchy rash on the arm and chest. Evidence of patient scratching present. Patient has been applying unknown cream, without help. - Physical exam reveals small bumps and evidence of scratching. - Advised to mix hydrocortisone cream with over-the-counter Eucerin or Aquaphor lotion and apply to affected areas. Discussed side effects of hydrocortisone cream, will prescribe  today.   Patient to follow up in 1 month for headaches and insomnia.   Follow up in about 4 weeks (around 09/11/2023) for Followup, headaches, insomnia.   Patient's Medications  New  Prescriptions   BLOOD PRESSURE KIT    1 each by Does not apply route daily.   HYDROCORTISONE 2.5 % CREAM    Apply to affected area 2 times daily   TRAZODONE (DESYREL) 50 MG TABLET    Take one tablet (50 mg dose) by mouth at bedtime.  Previous Medications   ACETAMINOPHEN  (TYLENOL ) 500 MG TABLET    Take two tablets (1,000 mg dose) by mouth every 6 (six) hours as needed.   BENZONATATE (TESSALON PERLES) 100 MG CAPSULE    Take one capsule (100 mg dose) by mouth 3 (three) times a day as needed for Cough.   CICLOPIROX 1 % SHAMPOO    Shampoo scalp twice weekly at bedtime.   CLOTRIMAZOLE-BETAMETHASONE (LOTRISONE) LOTION    Apply topically 2 (two) times daily.   FLUTICASONE PROPIONATE (FLONASE) 50 MCG/ACTUATION NASAL SPRAY    two sprays by Nasal route daily.   HYDROCHLOROTHIAZIDE 12.5 MG TABLET    Take one tablet (12.5 mg dose) by mouth daily.   OMEPRAZOLE  (PRILOSEC) 20 MG CAPSULE    Take one capsule (20 mg dose) by mouth daily.   PROMETHAZINE-DEXTROMETHORPHAN (PROMETHAZINE-DM) 6.25-15 MG/5ML SYRUP    Take 5 mLs by mouth at bedtime as needed for Cough.   PROPRANOLOL HCL (INDERAL) 10 MG TABLET    Take one tablet (10 mg dose) by mouth 2 (two) times daily.   SUMATRIPTAN SUCCINATE (IMITREX) 50 MG TABLET    Take one tablet (50 mg dose) by mouth every 2 (two) hours as needed for Migraine.  Modified Medications   No medications on file  Discontinued Medications   No medications on file       Subjective   Patient ID:  Paige Bruce is a 43 y.o. (DOB February 10, 1981) female    Patient presents with  . Insomnia    Pt has some bumps on her arms , itching     History of Present Illness The patient is a 43 year old female who presents for insomnia, anxiety, and skin issues.  She reports experiencing dry, itchy skin on her arm, which she has been scratching. The itching extends to her chest as well. She has been applying cream to the affected areas but has recently stopped because she is unsure if it has  been effective. She is unsure the name of the cream.   She also mentions feelings of anxiety, which disrupt her sleep.  She typically wakes up around 3 or 4 in the morning thinking about tasks she needs to complete the next day. She then goes to the kitchen for a drink or snack, and then returns to bed. She often has another awakening in the early mornings. She experiences pain in her eyes and head, particularly when trying to sleep. She has attempted to use melatonin 3 to 4 times to aid sleep but found it ineffective. Her bedtime routine includes turning off the TV, showering, and brushing her teeth. She finds it difficult to relax and sleep when others are awake in the house.. She has experienced significant life stressors, including the death of her mother and a divorce from her ex-husband. She currently has a supportive boyfriend. She feels pressure to maintain a strict routine for herself and those around her. She occasionally feels unmotivated and prefers to stay at home  with her children.  She takes propranolol in the morning and has noticed an increase in headaches since discontinuing daily soda consumption 4 months ago. She now drinks orange juice, lemonade, and coffee, but admits to only drinking a bottle of water over the course of two days. She experiences frequent urination when she drinks water, which discourages her from doing so.     Reviewed and updated this visit by provider: Tobacco  Allergies  Meds  Problems  Med Hx  Surg Hx  Fam Hx        Review of Systems  See HPI for ROS. Except as stated in the HPI, all other systems reviewed and are negative.      Objective  BP 100/64 (BP Location: Left Upper Arm, Patient Position: Sitting)   Pulse 83   Temp 98 F (36.7 C) (Skin)   Resp 16   Ht 5' (1.524 m)   Wt 139 lb 9.6 oz (63.3 kg)   LMP 07/16/2023 (Exact Date)   SpO2 97%   Breastfeeding No   BMI 27.26 kg/m   Wt Readings from Last 3 Encounters:  08/14/23 139 lb 9.6  oz (63.3 kg)  06/28/23 135 lb 12.8 oz (61.6 kg)  06/11/23 139 lb 3.2 oz (63.1 kg)   Temp Readings from Last 3 Encounters:  08/14/23 98 F (36.7 C) (Skin)  06/28/23 98.2 F (36.8 C) (Skin)  06/11/23 98.7 F (37.1 C)   BP Readings from Last 3 Encounters:  08/14/23 100/64  06/28/23 122/84  06/11/23 114/78   Pulse Readings from Last 3 Encounters:  08/14/23 83  06/28/23 85  06/11/23 87     Physical Exam Vitals and nursing note reviewed.  Constitutional:      Appearance: Normal appearance.  HENT:     Head: Atraumatic.  Eyes:     General: Lids are normal.     Pupils: Pupils are equal, round, and reactive to light.  Neck:     Vascular: No carotid bruit.  Cardiovascular:     Rate and Rhythm: Normal rate and regular rhythm.     Heart sounds: Normal heart sounds. No murmur heard. Musculoskeletal:     Cervical back: No muscular tenderness.     Right lower leg: No edema.     Left lower leg: No edema.  Pulmonary:     Effort: Pulmonary effort is normal. No respiratory distress.     Breath sounds: Normal breath sounds. No wheezing.  Skin:    Findings: Rash present. Rash is papular.          Comments: Red is area of flesh colored, sparse papules ~64mmx1mm without erythema or tenderness. Erythematous marks from previous scratching present bilaterally.   Neurological:     Mental Status: She is alert and oriented to person, place, and time.  Psychiatric:        Mood and Affect: Mood normal.        Behavior: Behavior normal.        Cognition and Memory: Memory normal.       Risks, benefits, and alternatives of the medications and treatment plan prescribed today were discussed, and patient expressed understanding. Plan follow-up as discussed or as needed if any worsening symptoms or change in condition.      Clarke Clause, PA-C I have reviewed this note and agree with the plan. Erminio Sensing, MD

## 2023-08-14 NOTE — Telephone Encounter (Signed)
 No answer left voice mail

## 2023-08-20 LAB — SURGICAL PATHOLOGY

## 2023-08-22 ENCOUNTER — Ambulatory Visit: Payer: Self-pay | Admitting: Pediatrics

## 2023-08-22 DIAGNOSIS — A048 Other specified bacterial intestinal infections: Secondary | ICD-10-CM

## 2023-08-22 MED ORDER — BIS SUBCIT-METRONID-TETRACYC 140-125-125 MG PO CAPS: 3.0000 | ORAL_CAPSULE | Freq: Three times a day (TID) | ORAL | 0 refills | Status: AC

## 2023-08-22 NOTE — Addendum Note (Signed)
 Addended by: Nilam Quakenbush N on: 08/22/2023 02:18 PM   Modules accepted: Orders

## 2023-10-13 NOTE — Progress Notes (Signed)
 Subjective   Patient ID:  Paige Bruce is a 43 y.o. (DOB 28-Mar-1980) female    Patient presents with  . Medication Management  . Hand Pain    RIGHT HAND   . Knee Pain    Right knee     Hand Pain   Knee Pain    History of Present Illness The patient presents for evaluation of right hand pain, knee pain, anxiety, and hemorrhoids.  She has been experiencing numbness in her right hand upon waking up for the past 2 weeks. This numbness has made it difficult to perform tasks such as opening a bottle. Pain in the hand occurs when lifting heavy objects. She is right-handed and does not use a brace for her hand. Tylenol  provides partial relief for the pain, and applying pressure to the hand alleviates it. 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  She has a history of knee issues dating back 5 to 7 years. The knee feels unstable during running or prolonged walking, and the pain intensifies with changes in weather. The pain is intermittent and worsens with changes in weather. She recalls receiving an injection from her doctor, which provided relief for 3 to 6 months. She was previously referred to [facility] for her knee condition. A cream prescribed by her doctor in [place]  for knee pain is helpful.  Her anxiety is well-managed with daily medication. She is currently unemployed and seeking new employment, with interviews scheduled for today and tomorrow. She is unsure about the need for a refill of her anxiety medication.  She requests a refill of her hemorrhoid cream.  Occupation: Currently unemployed, seeking new employment    Reviewed and updated this visit by provider: Tobacco  Allergies  Meds  Problems  Med Hx  Surg Hx  Fam Hx        Review of Systems  is complete and negative except as noted.  Objective   Vitals:   10/10/23 1459  BP: 114/78  Patient Position: Sitting  Pulse: 79  Temp: 97.8 F (36.6 C)   TempSrc: Temporal  Height: 5' (1.524 m)  Weight: 140 lb 6.4 oz (63.7 kg)  SpO2: 98%  BMI (Calculated): 27.4  PainSc:   8  PainLoc: Hand     Physical Exam Vitals and nursing note reviewed.  Constitutional:      General: She is not in acute distress.    Appearance: Normal appearance.  Cardiovascular:     Rate and Rhythm: Normal rate and regular rhythm.     Pulses: Normal pulses.     Heart sounds: Normal heart sounds. No murmur heard. Musculoskeletal:     Right wrist: No swelling or deformity. Normal range of motion.     Right knee: No swelling. Normal range of motion. Tenderness present over the medial joint line.     Comments: (-) phalen and tinel +Finkelstein test   Pulmonary:     Effort: Pulmonary effort is normal. No respiratory distress.     Breath sounds: Normal breath sounds.  Skin:    General: Skin is warm and dry.     Capillary Refill: Capillary refill takes less than 2 seconds.  Neurological:     Mental Status: She is alert and oriented to person, place, and time. Mental status is at baseline.     Gait: Gait normal.  Psychiatric:        Mood and Affect: Mood normal.  Behavior: Behavior normal.        Thought Content: Thought content normal.       Assessment and Plan  1. Right knee pain, unspecified chronicity (Primary) -     Ambulatory referral to Orthopedic Surgery -     diclofenac sodium (VOLTAREN) 1 % gel; Apply 2-4 grams up to 4 times a day as needed to affected joint, Normal 2. Hemorrhoids, unspecified hemorrhoid type -     hydrocortisone (ANUSOL-HC) 2.5% rectal cream; Apply rectally 2 times daily, Normal 3. GAD (generalized anxiety disorder) 4. De Quervain's tenosynovitis, right    Assessment & Plan 1. Right hand pain: - Symptoms suggest tenosynovitis, characterized by inflammation of the tendons leading to numbness and weakness in the hand. - Physical exam findings include numbness in the thumb and fingers, and pain with certain movements. -  A brace for the right hand was recommended, which can be purchased from [employer], [facility], or [facility]. Resting the hand was also suggested due to overuse. If the brace does not alleviate the pain after a month, an injection may be considered. If the injection proves ineffective, surgical intervention may be necessary. - A printout of the brace was provided.  2. Knee pain: - The knee pain has been ongoing for years and worsens with weather changes. - Previous x-rays and an injection administered at [facility] suggest arthritis. - A referral to orthopedics will be made for further evaluation of the knee pain. - A prescription for a cream to apply on her knee was sent to pharmacy.  3. Anxiety: - Her anxiety is well-managed with her current medication regimen. - She was advised to continue her current medication regimen. - She has one more refill available for her anxiety medication.  4. Hemorrhoids: - A prescription for a cream to apply on her hemorrhoids was sent to pharmacy.  Follow-up: A follow-up appointment is scheduled in 4 weeks to monitor the condition of her wrist.  Follow up in about 4 weeks (around 11/07/2023) for wrist pain follow up .  Computer technology was used to create visit note.  Consent from the patient/caregiver was obtained prior to its use.      Patient's Medications  New Prescriptions   DICLOFENAC SODIUM (VOLTAREN) 1 % GEL    Apply 2-4 grams up to 4 times a day as needed to affected joint   HYDROCORTISONE (ANUSOL-HC) 2.5% RECTAL CREAM    Apply rectally 2 times daily  Previous Medications   ACETAMINOPHEN  (TYLENOL ) 500 MG TABLET    Take two tablets (1,000 mg dose) by mouth every 6 (six) hours as needed.   BISMUTH-METRONIDAZOLE-TETRACYCLINE (PYLERA) 140-125-125 MG PER CAPSULE    Take three capsules by mouth after meals and at bedtime.   BLOOD PRESSURE KIT    1 each by Does not apply route daily.   CICLOPIROX 1 % SHAMPOO    Shampoo scalp twice weekly at bedtime.    CLOTRIMAZOLE-BETAMETHASONE (LOTRISONE) LOTION    Apply topically 2 (two) times daily.   ESCITALOPRAM OXALATE (LEXAPRO) 10 MG TABLET    Take one tablet (10 mg dose) by mouth daily.   HYDROCHLOROTHIAZIDE 12.5 MG TABLET    Take one tablet (12.5 mg dose) by mouth daily.   HYDROCORTISONE 2.5 % CREAM    Apply to affected area 2 times daily   HYDROXYZINE HCL (ATARAX) 25 MG TABLET    Take one tablet (25 mg dose) by mouth at bedtime as needed (Insomnia).   OMEPRAZOLE  (PRILOSEC) 20 MG CAPSULE  Take one capsule (20 mg dose) by mouth daily.   PROPRANOLOL HCL (INDERAL) 10 MG TABLET    Take one tablet (10 mg dose) by mouth 2 (two) times daily.   SUMATRIPTAN SUCCINATE (IMITREX) 50 MG TABLET    Take one tablet (50 mg dose) by mouth every 2 (two) hours as needed for Migraine.  Modified Medications   No medications on file  Discontinued Medications   No medications on file        Risks, benefits, and alternatives of the medications and treatment plan prescribed today were discussed, and patient expressed understanding. Plan follow-up as discussed or as needed if any worsening symptoms or change in condition.   A yearly preventative health exam was recommended and current age based recommendations were discussed.

## 2023-10-23 NOTE — Progress Notes (Deleted)
 [facility] Return Visit   Referring Provider Mavis Redge SAILOR, FNP [address] [place],  [place] [address]  Primary Care Provider Mavis Redge SAILOR, FNP  Patient Profile: Paige Bruce is a 43 y.o. female returns to the [facility] office for follow-up of the problem(s) noted below.  Problem List: RUQ abdominal pain H. pylori gastritis diagnosed 09/19/2023 Status post cholecystectomy Constipation Family history of  gastric cancer -father diagnosed at age 79 Liver mass on imaging - ?  Hemangioma   History of Present Illness   Paige Bruce is a 43 y.o. female with a history of with a past medical history noteworthy for HTN and migraines who presents to the office for evaluation of right upper quadrant abdominal pain, chest pain and constipation  Discussed the use of AI scribe software for clinical note transcription with the patient, who gave verbal consent to proceed.  History of Present Illness      -- Recently developed the abrupt onset of right-sided abdominal pain when laying down to go to bed at night -- Pain described as little needles but also sharp and twisting at times -- States that at the onset of her symptoms she also had chest pain which now seems to be better -- Pain is exacerbated by the ingestion of greasy and fried foods -- Has experienced nausea but is not vomiting -- Endorses diminished appetite -- Denies symptoms of GERD, regurgitation, dysphagia or odynophagia -- She has had a prior cholecystectomy -- Chart reports a history of peptic ulcer disease with H. Pylori -she denies a known history of stomach ulcers or H. pylori infection-she is not aware of being treated for this in the past -- States that her father had stomach cancer at the age of 95  -- Has constipation and sometimes is only stooling once every 3 to 4 days -- No blood or mucus in stool  -- Endorses gas and bloating -- Used MiraLAX for 2 to  3 days but did not find this beneficial -- Previous clinic notes document a family history of colon cancer -she clarifies there is no colon cancer but rather stomach cancer as noted above  -- Seen in ED 06/23/2023 for evaluation of the symptoms -- CBC, CMP, lipase, UA and urinalysis normal -- CTAP -no acute findings, no appendicitis, stable 3.4 cm mass in the caudate lobe of the liver similar to 2016 CT report   GI Review of Symptoms Significant for abdominal pain, constipation, nausea. Otherwise negative.  General Review of Systems  Review of systems is significant for the pertinent positives and negatives as listed per the HPI.  Full ROS is otherwise negative.  Past Medical History   Past Medical History:  Diagnosis Date   GERD (gastroesophageal reflux disease)    HTN (hypertension)    Migraine      Past Surgical History   Past Surgical History:  Procedure Laterality Date   CHOLECYSTECTOMY     TUBAL LIGATION       Allergies and Medications   No Known Allergies    No outpatient medications have been marked as taking for the 10/24/23 encounter (Appointment) with Suzann Inocente HERO, MD.    Family History   Family History  Problem Relation Age of Onset   Stomach cancer Father    Diabetes Father    Hypertension Father    Cancer Father        Gastric cancer age 41   Liver disease Neg Hx  Colon cancer Neg Hx    Esophageal cancer Neg Hx    Rectal cancer Neg Hx      Social History   Social History   Tobacco Use   Smoking status: Never   Smokeless tobacco: Never  Vaping Use   Vaping status: Never Used  Substance Use Topics   Alcohol use: No   Drug use: No   [name] reports that she has never smoked. She has never used smokeless tobacco. She reports that she does not drink alcohol and does not use drugs.  Vital Signs and Physical Examination   There were no vitals filed for this visit.  There is no height or weight on file to calculate BMI.     General: Well developed, well nourished, no acute distress Head: Normocephalic and atraumatic Eyes: Sclerae anicteric, EOMI Lungs: Clear throughout to auscultation Heart: Regular rate and rhythm; No murmurs, rubs or bruits Abdomen: Soft, tender to palpation over right upper quadrant and epigastrium, and non distended. No masses, hepatosplenomegaly or hernias noted. Normal Bowel sounds Rectal: Deferred Musculoskeletal: Symmetrical with no gross deformities   Review of Data  The following data was reviewed at the time of this encounter:  Laboratory Studies      Latest Ref Rng & Units 06/22/2023   10:41 PM 05/21/2020    7:42 PM 06/26/2019    8:18 PM  CBC  WBC 4.0 - 10.5 K/uL 13.8  12.9  19.8   Hemoglobin 12.0 - 15.0 g/dL 86.8  87.1  85.0   Hematocrit 36.0 - 46.0 % 38.6  38.5  46.1   Platelets 150 - 400 K/uL 431  356  435     Lab Results  Component Value Date   LIPASE 44.0 07/06/2023      Latest Ref Rng & Units 06/22/2023   10:41 PM 05/21/2020    7:42 PM 06/26/2019    8:18 PM  CMP  Glucose 70 - 99 mg/dL 892  895  96   BUN 6 - 20 mg/dL 10  10  18    Creatinine 0.44 - 1.00 mg/dL 9.31  9.44  9.15   Sodium 135 - 145 mmol/L 139  137  135   Potassium 3.5 - 5.1 mmol/L 3.3  3.3  4.6   Chloride 98 - 111 mmol/L 102  102  107   CO2 22 - 32 mmol/L 27  24  19    Calcium 8.9 - 10.3 mg/dL 9.7  9.0  8.9   Total Protein 6.5 - 8.1 g/dL 7.8  8.0  8.1   Total Bilirubin 0.0 - 1.2 mg/dL 0.4  0.5  0.6   Alkaline Phos 38 - 126 U/L 75  111  94   AST 15 - 41 U/L 20  34  25   ALT 0 - 44 U/L 16  37  31    Lab Results  Component Value Date   ESRSEDRATE 12 07/06/2023   Lab Results  Component Value Date   CRP <1.0 07/06/2023   Celiac panel normal   Imaging Studies  CTAP 06/23/2023 No acute finding.  No appendicitis. 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 female with;  RUQ abdominal pain Chest pain Status post cholecystectomy Constipation Family  history of  gastric cancer -father diagnosed at age 21 Liver mass on imaging - ?  Hemangioma  Paige Bruce presents for evaluation and management of right upper quadrant abdominal pain, chest discomfort and constipation.  She denies overt symptoms of GERD, dysphagia or odynophagia.  She has a previous pertinent history of cholecystectomy.  She was evaluated in the emergency room recently for the symptoms and CTAP was reassuring without any acute abnormalities.  There was a stable liver lesion which is unlikely to be contributing to her symptoms.  Her records suggest there is a history of peptic ulcer disease and H. pylori but she does not recall this.  She does note that her father had a history of gastric cancer at the age of 64.  In light of her current upper GI symptoms I have recommended initiating a PPI, performing screening laboratory studies for inflammatory conditions and celiac disease and and scheduling her for an upper endoscopy for for further investigation of gastritis, peptic ulcer disease, H. pylori infection, duodenitis.  For management of constipation, I will provide her with samples of Linzess  to see if this ameliorates her symptoms of constipation.  In the absence of obvious lesions on her CT scan, I do not necessarily feel compelled to perform a colonoscopy at this time.  If symptoms are not improving in the future we can reevaluate this as an option.  The liver lesion on her CT scan has been stable since 2016.  Based upon that, I suspect she likely has a hemangioma.  Can consider the possibility of a follow-up MRI in the future for additional confirmation.  Plan  Labs today: Lipase, ESR, CRP, celiac panel, vitamin B12, folate Schedule EGD at [facility] with  biopsies to rule out H. Pylori Start omeprazole  40 mg orally daily 20 to 30 minutes before meal Trial of Linzess  145 mcg tablets orally daily -this can be dose titrated depending upon the effect that she sees with her bowel habits to a higher or lower dose If symptoms of constipation or not improving in the future can reevaluate need for possible colonoscopy-otherwise recommend colorectal cancer screening starting at age 64 Consider follow-up liver MRI in the future for further characterization of liver lesion  Planned Follow Up  3 months  The patient or caregiver verbalized understanding of the material covered, with no barriers to understanding. All questions were answered. Patient or caregiver is agreeable with the plan outlined above.    It was a pleasure to see Paige Bruce.  If you have any questions or concerns regarding this evaluation, do not hesitate to contact me.  Inocente Hausen, MD [facility]

## 2023-10-24 ENCOUNTER — Ambulatory Visit: Admitting: Pediatrics

## 2023-11-06 ENCOUNTER — Ambulatory Visit
Admission: EM | Admit: 2023-11-06 | Discharge: 2023-11-06 | Disposition: A | Discharge status: Home / Self Care | Attending: Family Medicine | Admitting: Family Medicine

## 2023-11-06 ENCOUNTER — Encounter: Payer: Self-pay | Admitting: Pediatrics

## 2023-11-06 ENCOUNTER — Ambulatory Visit: Payer: Self-pay | Admitting: Urgent Care

## 2023-11-06 ENCOUNTER — Ambulatory Visit (HOSPITAL_BASED_OUTPATIENT_CLINIC_OR_DEPARTMENT_OTHER)
Admission: RE | Admit: 2023-11-06 | Discharge: 2023-11-06 | Disposition: A | Discharge status: Home / Self Care | Source: Ambulatory Visit | Attending: Urgent Care | Admitting: Urgent Care

## 2023-11-06 ENCOUNTER — Encounter: Payer: Self-pay | Admitting: Urgent Care

## 2023-11-06 ENCOUNTER — Other Ambulatory Visit: Payer: Self-pay

## 2023-11-06 DIAGNOSIS — M25572 Pain in left ankle and joints of left foot: Secondary | ICD-10-CM | POA: Diagnosis present

## 2023-11-06 DIAGNOSIS — S93402A Sprain of unspecified ligament of left ankle, initial encounter: Secondary | ICD-10-CM

## 2023-11-06 HISTORY — DX: Anxiety disorder, unspecified: F41.9

## 2023-11-06 HISTORY — DX: Essential (primary) hypertension: I10

## 2023-11-06 MED ORDER — NAPROXEN 500 MG PO TABS: 500.0000 mg | ORAL_TABLET | Freq: Two times a day (BID) | ORAL | 0 refills | Status: AC

## 2023-11-06 NOTE — Discharge Instructions (Signed)
 I have placed orders to have an x-ray done at the med center in [facility].  Please had there now.  Go through the main hospital and not the emergency room.  Once you are there and let them know that you will came to our clinic and we send she to their facility for an outpatient x-ray.  If no one is at the front desk then they are likely out the rest of the day and at that point you would have to go through the emergency room.  Do not check in as a patient through the emergency room.  Simply let them know that you are there for an outpatient x-ray from our clinic.  I will call you with your results and update our treatment plan if necessary after I get the report.

## 2023-11-06 NOTE — ED Provider Notes (Signed)
 [facility]  Note:  This document was prepared using Conservation officer, historic buildings and may include unintentional dictation errors.  MRN: [id number] DOB: 01/15/1981  Subjective:   Paige Bruce is a 43 y.o. female presenting for 1 day history of severe left ankle pain that radiates into the dorsal aspect of her foot and also approximately toward her knee.  Symptoms started after she rolled her ankle laterally.  Has been using icing, elevation and compression without relief.  Has had significant difficulty bearing any kind of weight.  No current facility-administered medications for this encounter.  Current Outpatient Medications:    escitalopram (LEXAPRO) 10 MG tablet, Take 10 mg by mouth daily., Disp: , Rfl:    hydrochlorothiazide (HYDRODIURIL) 12.5 MG tablet, Take 12.5 mg by mouth daily., Disp: , Rfl:    propranolol (INDERAL) 10 MG tablet, Take 10 mg by mouth 2 (two) times daily., Disp: , Rfl:    SUMAtriptan (IMITREX) 50 MG tablet, Take by mouth., Disp: , Rfl:    traZODone (DESYREL) 50 MG tablet, Take 50 mg by mouth at bedtime., Disp: , Rfl:    No Known Allergies  Past Medical History:  Diagnosis Date   Anxiety    Hypertension    Migraine      Past Surgical History:  Procedure Laterality Date   CHOLECYSTECTOMY     TUBAL LIGATION      History reviewed. No pertinent family history.  Social History   Tobacco Use   Smoking status: Never   Smokeless tobacco: Never  Vaping Use   Vaping status: Never Used  Substance Use Topics   Alcohol use: Yes    Comment: Occa   Drug use: Never    ROS   Objective:   Vitals: BP 125/83 (BP Location: Right Arm)   Pulse 97   Temp 98.8 F (37.1 C) (Oral)   Resp 16   LMP 09/25/2023 (Exact Date)   SpO2 97%   Physical Exam Constitutional:      General: She is not in acute distress.    Appearance: Normal appearance. She is well-developed. She is not ill-appearing, toxic-appearing or diaphoretic.   HENT:     Head: Normocephalic and atraumatic.     Nose: Nose normal.     Mouth/Throat:     Mouth: Mucous membranes are moist.  Eyes:     General: No scleral icterus.       Right eye: No discharge.        Left eye: No discharge.     Extraocular Movements: Extraocular movements intact.  Cardiovascular:     Rate and Rhythm: Normal rate.  Pulmonary:     Effort: Pulmonary effort is normal.  Musculoskeletal:     Left ankle: No swelling, deformity, ecchymosis or lacerations. Tenderness present over the lateral malleolus, ATF ligament and AITF ligament. No medial malleolus, CF ligament, posterior TF ligament, base of 5th metatarsal or proximal fibula tenderness. Decreased range of motion. Normal pulse.     Left Achilles Tendon: No tenderness or defects. Thompson's test negative.     Left foot: Normal range of motion and normal capillary refill. No swelling, deformity, laceration, tenderness, bony tenderness or crepitus.  Skin:    General: Skin is warm and dry.  Neurological:     General: No focal deficit present.     Mental Status: She is alert and oriented to person, place, and time.  Psychiatric:        Mood and Affect: Mood normal.  Behavior: Behavior normal.    Left ankle wrapped using 4 Ace wrap in figure-8 method.  Patient provided with crutches for ambulation.  Assessment and Plan :   PDMP not reviewed this encounter.  1. Acute left ankle pain   2. Sprain of left ankle, unspecified ligament, initial encounter    Will pursue outpatient imaging, x-ray order placed.  For now, will manage for ankle sprain with rice method, NSAID. Counseled patient on potential for adverse effects with medications prescribed/recommended today, ER and return-to-clinic precautions discussed, patient verbalized understanding.    Christopher Savannah, [place] 11/06/23 [phone]

## 2023-11-06 NOTE — ED Triage Notes (Signed)
 Pt reports pain, swelling in the left ankle since last night after she twisted her ankle. States the toe sin the left feet feels numb. Ice compress gives no relief. Pian is worse when she put pressure on.

## 2023-12-17 NOTE — Progress Notes (Deleted)
 [facility] Return Visit   Referring Provider Mavis Redge SAILOR, FNP [address] [place],  [place] [address]  Primary Care Provider Pcp, No  Patient Profile: Paige Bruce is a 43 y.o. female  who returns to the [facility] office for follow-up of the problem(s) noted below.  Problem List: H. pylori gastritis diagnosed 07/2023 Status post cholecystectomy Constipation Family history of  gastric cancer -father diagnosed at age 32 Liver mass on imaging - ?  Hemangioma   History of Present Illness    Discussed the use of AI scribe software for clinical note transcription with the patient, who gave verbal consent to proceed.  History of Present Illness Paige Bruce is a 43 year old woman with a past medical history noteworthy for HTN and migraines who returns to the gastroenterology office for follow-up of H. pylori gastritis diagnosed 07/2023 and constipation  Current GI Meds    Interval History    -- Recently developed the abrupt onset of right-sided abdominal pain when laying down to go to bed at night -- Pain described as little needles but also sharp and twisting at times -- States that at the onset of her symptoms she also had chest pain which now seems to be better -- Pain is exacerbated by the ingestion of greasy and fried foods -- Has experienced nausea but is not vomiting -- Endorses diminished appetite -- Denies symptoms of GERD, regurgitation, dysphagia or odynophagia -- She has had a prior cholecystectomy -- Chart reports a history of peptic ulcer disease with H. Pylori -she denies a known history of stomach ulcers or H. pylori infection-she is not aware of being treated for this in the past -- States that her father had stomach cancer at the age of 80  -- Has constipation and sometimes is only stooling once every 3 to 4 days -- No blood or mucus in stool  -- Endorses gas and bloating -- Used MiraLAX for 2 to 3 days  but did not find this beneficial -- Previous clinic notes document a family history of colon cancer -she clarifies there is no colon cancer but rather stomach cancer as noted above  -- Seen in ED 06/23/2023 for evaluation of the symptoms -- CBC, CMP, lipase, UA and urinalysis normal -- CTAP -no acute findings, no appendicitis, stable 3.4 cm mass in the caudate lobe of the liver similar to 2016 CT report   GI Review of Symptoms Significant for abdominal pain, constipation, nausea. Otherwise negative.  General Review of Systems  Review of systems is significant for the pertinent positives and negatives as listed per the HPI.  Full ROS is otherwise negative.  Past Medical History   Past Medical History:  Diagnosis Date   Anxiety    GERD (gastroesophageal reflux disease)    HTN (hypertension)    Hypertension    Migraine      Past Surgical History   Past Surgical History:  Procedure Laterality Date   CHOLECYSTECTOMY     TUBAL LIGATION       Allergies and Medications   No Known Allergies    No outpatient medications have been marked as taking for the 12/19/23 encounter (Appointment) with Suzann Inocente HERO, MD.    Family History   Family History  Problem Relation Age of Onset   Stomach cancer Father    Diabetes Father    Hypertension Father    Cancer Father        Gastric cancer age 62   Liver  disease Neg Hx    Colon cancer Neg Hx    Esophageal cancer Neg Hx    Rectal cancer Neg Hx      Social History   Social History   Tobacco Use   Smoking status: Never   Smokeless tobacco: Never  Vaping Use   Vaping status: Never Used  Substance Use Topics   Alcohol use: Yes    Comment: Occa   Drug use: Never   Azizah reports that she has never smoked. She has never used smokeless tobacco. She reports current alcohol use. She reports that she does not use drugs.  Vital Signs and Physical Examination   There were no vitals filed for this visit.  There is no  height or weight on file to calculate BMI.    General: Well developed, well nourished, no acute distress Head: Normocephalic and atraumatic Eyes: Sclerae anicteric, EOMI Lungs: Clear throughout to auscultation Heart: Regular rate and rhythm; No murmurs, rubs or bruits Abdomen: Soft, tender to palpation over right upper quadrant and epigastrium, and non distended. No masses, hepatosplenomegaly or hernias noted. Normal Bowel sounds Rectal: Deferred Musculoskeletal: Symmetrical with no gross deformities   Review of Data  The following data was reviewed at the time of this encounter:  Laboratory Studies      Latest Ref Rng & Units 06/22/2023   10:41 PM 05/21/2020    7:42 PM 06/26/2019    8:18 PM  CBC  WBC 4.0 - 10.5 K/uL 13.8  12.9  19.8   Hemoglobin 12.0 - 15.0 g/dL 86.8  87.1  85.0   Hematocrit 36.0 - 46.0 % 38.6  38.5  46.1   Platelets 150 - 400 K/uL 431  356  435     Lab Results  Component Value Date   LIPASE 44.0 07/06/2023      Latest Ref Rng & Units 06/22/2023   10:41 PM 05/21/2020    7:42 PM 06/26/2019    8:18 PM  CMP  Glucose 70 - 99 mg/dL 892  895  96   BUN 6 - 20 mg/dL 10  10  18    Creatinine 0.44 - 1.00 mg/dL 9.31  9.44  9.15   Sodium 135 - 145 mmol/L 139  137  135   Potassium 3.5 - 5.1 mmol/L 3.3  3.3  4.6   Chloride 98 - 111 mmol/L 102  102  107   CO2 22 - 32 mmol/L 27  24  19    Calcium 8.9 - 10.3 mg/dL 9.7  9.0  8.9   Total Protein 6.5 - 8.1 g/dL 7.8  8.0  8.1   Total Bilirubin 0.0 - 1.2 mg/dL 0.4  0.5  0.6   Alkaline Phos 38 - 126 U/L 75  111  94   AST 15 - 41 U/L 20  34  25   ALT 0 - 44 U/L 16  37  31    Lab Results  Component Value Date   ESRSEDRATE 12 07/06/2023   Lab Results  Component Value Date   CRP <1.0 07/06/2023   Celiac panel normal   Imaging Studies  CTAP 06/23/2023 No acute finding.  No appendicitis. 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  GI Procedures and Studies  EGD 08/13/2023 Diffuse moderate gastritis,  otherwise normal Path: H. pylori gastritis, normal duodenum  Clinical Impression  It is my clinical impression that Paige Bruce is a 43 y.o. female with;  RUQ abdominal pain Chest pain Status  post cholecystectomy Constipation Family history of  gastric cancer -father diagnosed at age 30 Liver mass on imaging - ?  Hemangioma  Ms. Santee presents for evaluation and management of right upper quadrant abdominal pain, chest discomfort and constipation.  She denies overt symptoms of GERD, dysphagia or odynophagia.  She has a previous pertinent history of cholecystectomy.  She was evaluated in the emergency room recently for the symptoms and CTAP was reassuring without any acute abnormalities.  There was a stable liver lesion which is unlikely to be contributing to her symptoms.  Her records suggest there is a history of peptic ulcer disease and H. pylori but she does not recall this.  She does note that her father had a history of gastric cancer at the age of 73.  In light of her current upper GI symptoms I have recommended initiating a PPI, performing screening laboratory studies for inflammatory conditions and celiac disease and and scheduling her for an upper endoscopy for for further investigation of gastritis, peptic ulcer disease, H. pylori infection, duodenitis.  For management of constipation, I will provide her with samples of Linzess  to see if this ameliorates her symptoms of constipation.  In the absence of obvious lesions on her CT scan, I do not necessarily feel compelled to perform a colonoscopy at this time.  If symptoms are not improving in the future we can reevaluate this as an option.  The liver lesion on her CT scan has been stable since 2016.  Based upon that, I suspect she likely has a hemangioma.  Can consider the possibility of a follow-up MRI in the future for additional confirmation.  Plan  Labs today: Lipase, ESR, CRP, celiac panel, vitamin B12, folate Schedule [facility] at [facility]  with biopsies to rule out H. Pylori Start omeprazole  40 mg orally daily 20 to 30 minutes before meal Trial of Linzess  145 mcg tablets orally daily -this can be dose titrated depending upon the effect that she sees with her bowel habits to a higher or lower dose If symptoms of constipation or not improving in the future can reevaluate need for possible colonoscopy-otherwise recommend colorectal cancer screening starting at age 86 Consider follow-up liver MRI in the future for further characterization of liver lesion  Planned Follow Up  3 months  The patient or caregiver verbalized understanding of the material covered, with no barriers to understanding. All questions were answered. Patient or caregiver is agreeable with the plan outlined above.    It was a pleasure to see Jaymi.  If you have any questions or concerns regarding this evaluation, do not hesitate to contact me.  Inocente Hausen, MD [facility]

## 2023-12-19 ENCOUNTER — Ambulatory Visit: Admitting: Pediatrics
